# Patient Record
Sex: Female | Born: 1935 | Race: White | Hispanic: No | Smoking: Never smoker
Health system: Southern US, Community
[De-identification: ages and names within clinical notes are randomized; demographics above are authoritative.]

## PROBLEM LIST (undated history)

## (undated) DIAGNOSIS — F0390 Unspecified dementia without behavioral disturbance: Secondary | ICD-10-CM

## (undated) DIAGNOSIS — E079 Disorder of thyroid, unspecified: Secondary | ICD-10-CM

## (undated) DIAGNOSIS — T8859XA Other complications of anesthesia, initial encounter: Secondary | ICD-10-CM

## (undated) DIAGNOSIS — I10 Essential (primary) hypertension: Secondary | ICD-10-CM

## (undated) DIAGNOSIS — T4145XA Adverse effect of unspecified anesthetic, initial encounter: Secondary | ICD-10-CM

## (undated) DIAGNOSIS — I2510 Atherosclerotic heart disease of native coronary artery without angina pectoris: Secondary | ICD-10-CM

## (undated) HISTORY — PX: INNER EAR SURGERY: SHX679

## (undated) HISTORY — PX: CHOLECYSTECTOMY: SHX55

## (undated) HISTORY — PX: CORONARY ARTERY BYPASS GRAFT: SHX141

## (undated) HISTORY — PX: LUMBAR SPINE SURGERY: SHX701

---

## 2011-01-12 ENCOUNTER — Emergency Department (HOSPITAL_COMMUNITY)
Admission: EM | Admit: 2011-01-12 | Discharge: 2011-01-12 | Disposition: A | Discharge status: Home / Self Care | Payer: Medicare Other | Attending: Emergency Medicine | Admitting: Emergency Medicine

## 2011-01-12 DIAGNOSIS — E7800 Pure hypercholesterolemia, unspecified: Secondary | ICD-10-CM | POA: Insufficient documentation

## 2011-01-12 DIAGNOSIS — E039 Hypothyroidism, unspecified: Secondary | ICD-10-CM | POA: Insufficient documentation

## 2011-01-12 DIAGNOSIS — Z951 Presence of aortocoronary bypass graft: Secondary | ICD-10-CM | POA: Insufficient documentation

## 2011-01-12 DIAGNOSIS — I10 Essential (primary) hypertension: Secondary | ICD-10-CM | POA: Insufficient documentation

## 2011-01-12 DIAGNOSIS — F411 Generalized anxiety disorder: Secondary | ICD-10-CM | POA: Insufficient documentation

## 2011-01-12 DIAGNOSIS — F0390 Unspecified dementia without behavioral disturbance: Secondary | ICD-10-CM | POA: Insufficient documentation

## 2011-01-12 LAB — COMPREHENSIVE METABOLIC PANEL
AST: 22 U/L (ref 0–37)
Albumin: 4.1 g/dL (ref 3.5–5.2)
Calcium: 10.2 mg/dL (ref 8.4–10.5)
Chloride: 94 mEq/L — ABNORMAL LOW (ref 96–112)
Creatinine, Ser: 0.98 mg/dL (ref 0.50–1.10)
Total Protein: 7.5 g/dL (ref 6.0–8.3)

## 2011-01-12 LAB — URINALYSIS, ROUTINE W REFLEX MICROSCOPIC
Glucose, UA: NEGATIVE mg/dL
Leukocytes, UA: NEGATIVE
Specific Gravity, Urine: 1.01 (ref 1.005–1.030)

## 2011-01-12 LAB — CBC
MCH: 31 pg (ref 26.0–34.0)
MCHC: 35.1 g/dL (ref 30.0–36.0)
MCV: 88.5 fL (ref 78.0–100.0)
Platelets: 188 10*3/uL (ref 150–400)
RDW: 13 % (ref 11.5–15.5)
WBC: 9.6 10*3/uL (ref 4.0–10.5)

## 2011-01-12 LAB — URINE MICROSCOPIC-ADD ON: Urine-Other: NONE SEEN

## 2011-01-14 ENCOUNTER — Emergency Department (HOSPITAL_COMMUNITY)
Admission: EM | Admit: 2011-01-14 | Discharge: 2011-01-14 | Disposition: A | Discharge status: Home / Self Care | Payer: Medicare Other | Attending: Emergency Medicine | Admitting: Emergency Medicine

## 2011-01-14 DIAGNOSIS — E039 Hypothyroidism, unspecified: Secondary | ICD-10-CM | POA: Insufficient documentation

## 2011-01-14 DIAGNOSIS — F0280 Dementia in other diseases classified elsewhere without behavioral disturbance: Secondary | ICD-10-CM | POA: Insufficient documentation

## 2011-01-14 DIAGNOSIS — G309 Alzheimer's disease, unspecified: Secondary | ICD-10-CM | POA: Insufficient documentation

## 2011-01-14 DIAGNOSIS — Z79899 Other long term (current) drug therapy: Secondary | ICD-10-CM | POA: Insufficient documentation

## 2011-01-14 DIAGNOSIS — I10 Essential (primary) hypertension: Secondary | ICD-10-CM | POA: Insufficient documentation

## 2011-01-14 DIAGNOSIS — Z951 Presence of aortocoronary bypass graft: Secondary | ICD-10-CM | POA: Insufficient documentation

## 2011-01-22 ENCOUNTER — Emergency Department (HOSPITAL_BASED_OUTPATIENT_CLINIC_OR_DEPARTMENT_OTHER)
Admission: EM | Admit: 2011-01-22 | Discharge: 2011-01-22 | Disposition: A | Discharge status: Home / Self Care | Payer: Medicare Other | Attending: Emergency Medicine | Admitting: Emergency Medicine

## 2011-01-22 ENCOUNTER — Emergency Department (INDEPENDENT_AMBULATORY_CARE_PROVIDER_SITE_OTHER): Payer: Medicare Other

## 2011-01-22 ENCOUNTER — Encounter: Payer: Self-pay | Admitting: *Deleted

## 2011-01-22 DIAGNOSIS — Y9210 Unspecified residential institution as the place of occurrence of the external cause: Secondary | ICD-10-CM | POA: Insufficient documentation

## 2011-01-22 DIAGNOSIS — S300XXA Contusion of lower back and pelvis, initial encounter: Secondary | ICD-10-CM

## 2011-01-22 DIAGNOSIS — W050XXA Fall from non-moving wheelchair, initial encounter: Secondary | ICD-10-CM

## 2011-01-22 DIAGNOSIS — F0390 Unspecified dementia without behavioral disturbance: Secondary | ICD-10-CM | POA: Insufficient documentation

## 2011-01-22 DIAGNOSIS — M533 Sacrococcygeal disorders, not elsewhere classified: Secondary | ICD-10-CM

## 2011-01-22 HISTORY — DX: Essential (primary) hypertension: I10

## 2011-01-22 HISTORY — DX: Disorder of thyroid, unspecified: E07.9

## 2011-01-22 HISTORY — DX: Unspecified dementia, unspecified severity, without behavioral disturbance, psychotic disturbance, mood disturbance, and anxiety: F03.90

## 2011-01-22 MED ORDER — ACETAMINOPHEN 325 MG PO TABS
650.0000 mg | ORAL_TABLET | Freq: Once | ORAL | Status: AC
  Administered 2011-01-22: 650 mg via ORAL
  Filled 2011-01-22: qty 2

## 2011-01-22 NOTE — ED Notes (Signed)
Pt. Family waiting on xray results and calling [facility] about Pt. Being transported by [facility] back to the [facility].  Pt. Is in no distress and is sleepy/ per EMS / per [facility] this is normal for the Pt.  Being sleepy.

## 2011-01-22 NOTE — ED Notes (Signed)
Pt. Has noted bruising on the L wrist area; red and blue in color.  Pt. Daughter at bedside aware of bruising on the L wrist.

## 2011-01-22 NOTE — ED Notes (Signed)
Placed abx ointment on the L elbow skin tear after cleaning the L elbow.  Noted kerlex placed on L elbow with gauze dressing.

## 2011-01-22 NOTE — ED Notes (Signed)
Pt. Is noted to have normal behavior per EMS staff.  Pt. Will answer some questions but also has history of dementia,

## 2011-01-22 NOTE — Discharge Instructions (Signed)
 Take 650 mg of tylenol  by mouth every 6 hours as needed for pain. Please be rechecked by your own doctor in 2 days.

## 2011-01-22 NOTE — ED Notes (Signed)
Pt. Is from Memory Care unit in [facility] and fell out of W/C   No complaints of Pain with EMS.  Pt. Is in normal behavior and is normally sleepy.   [facility] in Memory Care Unit is Pt. Home.

## 2011-01-22 NOTE — ED Notes (Signed)
Pt. Dressed by RN and EMT. Family at bedside during dressing the Pt.  Pt. Smiling and awake at discharge,

## 2011-01-22 NOTE — ED Provider Notes (Signed)
History     CSN: [id number] Arrival date & time: No admission date for patient encounter.  No chief complaint on file.   (Consider location/radiation/quality/duration/timing/severity/associated sxs/prior treatment) HPI Comments: Level 5 caveat due to history of dementia.  Pt was sitting on edge of wheelchair, was with technician at memory care unit of nursing facility and she slid down off of edge of chair onto the floor.  No head injury.  Pt was sent for further evaluation.  Pt did not complain of pain to EMS.  They transported here uneventfully.  Pt denies to me any HA, CP, SOB, abd pain, pain to back, neck, hips, elbows, shoulders.  Pt had minimal skin abrasion without any bleeding to left elbow tip.    The history is provided by the patient and the EMS personnel.    No past medical history on file.  No past surgical history on file.  No family history on file.  History  Substance Use Topics  . Smoking status: Not on file  . Smokeless tobacco: Not on file  . Alcohol Use: Not on file    OB History    No data available      Review of Systems  Unable to perform ROS: Dementia    Allergies  Review of patient's allergies indicates not on file.  Home Medications  No current outpatient prescriptions on file.  There were no vitals taken for this visit.  Physical Exam  Constitutional: She appears well-developed and well-nourished. No distress.  HENT:  Head: Normocephalic and atraumatic.  Eyes: Pupils are equal, round, and reactive to light.  Neck: Normal range of motion. Neck supple.  Pulmonary/Chest: Effort normal and breath sounds normal.  Abdominal: Soft. There is no tenderness. There is no rebound and no guarding.  Musculoskeletal: Normal range of motion. She exhibits no tenderness.       Back:  Neurological: She is alert. She has normal strength.  Skin: Skin is warm and dry.    ED Course  Procedures (including critical care time)  Labs Reviewed - No data  to display No results found.   No diagnosis found.    MDM  Pt's only exam findings are superficial kin abrasion to elbow with no suspicion for joint injury or fracture.  Did not violate through dermis.  Also  Mild tenderness at lower sacrum and coccyx. Pt can move both lower extremities equally and ROM of both hips are normal.  Will get plain films, most likely just contusion to coccyx is all.        4:42 PM I reviewed plain films, no obv fracture.    Gavin Pound. Liese Dizdarevic, MD 01/22/11 1642

## 2011-03-05 ENCOUNTER — Other Ambulatory Visit: Payer: Self-pay

## 2011-03-05 ENCOUNTER — Emergency Department (HOSPITAL_COMMUNITY)
Admission: EM | Admit: 2011-03-05 | Discharge: 2011-03-05 | Disposition: A | Discharge status: Home / Self Care | Payer: Medicare Other | Attending: Emergency Medicine | Admitting: Emergency Medicine

## 2011-03-05 ENCOUNTER — Emergency Department (HOSPITAL_COMMUNITY): Payer: Medicare Other

## 2011-03-05 ENCOUNTER — Encounter (HOSPITAL_COMMUNITY): Payer: Self-pay | Admitting: Emergency Medicine

## 2011-03-05 DIAGNOSIS — I10 Essential (primary) hypertension: Secondary | ICD-10-CM | POA: Insufficient documentation

## 2011-03-05 DIAGNOSIS — R55 Syncope and collapse: Secondary | ICD-10-CM | POA: Insufficient documentation

## 2011-03-05 DIAGNOSIS — I498 Other specified cardiac arrhythmias: Secondary | ICD-10-CM | POA: Insufficient documentation

## 2011-03-05 DIAGNOSIS — E079 Disorder of thyroid, unspecified: Secondary | ICD-10-CM | POA: Insufficient documentation

## 2011-03-05 DIAGNOSIS — R404 Transient alteration of awareness: Secondary | ICD-10-CM | POA: Insufficient documentation

## 2011-03-05 DIAGNOSIS — Z79899 Other long term (current) drug therapy: Secondary | ICD-10-CM | POA: Insufficient documentation

## 2011-03-05 DIAGNOSIS — F0390 Unspecified dementia without behavioral disturbance: Secondary | ICD-10-CM | POA: Insufficient documentation

## 2011-03-05 DIAGNOSIS — I2510 Atherosclerotic heart disease of native coronary artery without angina pectoris: Secondary | ICD-10-CM | POA: Insufficient documentation

## 2011-03-05 DIAGNOSIS — F29 Unspecified psychosis not due to a substance or known physiological condition: Secondary | ICD-10-CM | POA: Insufficient documentation

## 2011-03-05 DIAGNOSIS — R413 Other amnesia: Secondary | ICD-10-CM | POA: Insufficient documentation

## 2011-03-05 DIAGNOSIS — Z9889 Other specified postprocedural states: Secondary | ICD-10-CM | POA: Insufficient documentation

## 2011-03-05 HISTORY — DX: Atherosclerotic heart disease of native coronary artery without angina pectoris: I25.10

## 2011-03-05 LAB — CBC
HCT: 40.2 % (ref 36.0–46.0)
Hemoglobin: 13.5 g/dL (ref 12.0–15.0)
RDW: 13.6 % (ref 11.5–15.5)
WBC: 10.1 10*3/uL (ref 4.0–10.5)

## 2011-03-05 LAB — BASIC METABOLIC PANEL
CO2: 23 mEq/L (ref 19–32)
Chloride: 105 mEq/L (ref 96–112)
Creatinine, Ser: 1.02 mg/dL (ref 0.50–1.10)
GFR calc Af Amer: 56 mL/min — ABNORMAL LOW (ref >90)
Potassium: 3.5 mEq/L (ref 3.5–5.1)

## 2011-03-05 LAB — DIFFERENTIAL
Basophils Absolute: 0 10*3/uL (ref 0.0–0.1)
Basophils Relative: 0 % (ref 0–1)
Lymphocytes Relative: 27 % (ref 12–46)
Monocytes Absolute: 1.1 10*3/uL — ABNORMAL HIGH (ref 0.1–1.0)
Monocytes Relative: 11 % (ref 3–12)
Neutro Abs: 6 10*3/uL (ref 1.7–7.7)
Neutrophils Relative %: 60 % (ref 43–77)

## 2011-03-05 LAB — URINALYSIS, ROUTINE W REFLEX MICROSCOPIC
Glucose, UA: NEGATIVE mg/dL
Ketones, ur: NEGATIVE mg/dL
Leukocytes, UA: NEGATIVE
Nitrite: NEGATIVE
Specific Gravity, Urine: 1.012 (ref 1.005–1.030)
pH: 6.5 (ref 5.0–8.0)

## 2011-03-05 MED ORDER — SODIUM CHLORIDE 0.9 % IV BOLUS (SEPSIS)
1000.0000 mL | Freq: Once | INTRAVENOUS | Status: AC
  Administered 2011-03-05: 1000 mL via INTRAVENOUS

## 2011-03-05 NOTE — ED Provider Notes (Signed)
12:08 PM  Date: 03/05/2011  Rate: 49  Rhythm: sinus bradycardia  QRS Axis: normal  Intervals: PR prolonged  ST/T Wave abnormalities: normal  Conduction Disutrbances:none  Narrative Interpretation: Abnormal EKG.   Old EKG Reviewed: none available    Carleene Cooper III, MD 03/05/11 1209

## 2011-03-05 NOTE — ED Notes (Signed)
Family at bedside. 

## 2011-03-05 NOTE — ED Provider Notes (Signed)
History     CSN: [id number] Arrival date & time: 03/05/2011 11:50 AM   First MD Initiated Contact with Patient 03/05/11 1233      Chief Complaint  Patient presents with  . Near Syncope    (Consider location/radiation/quality/duration/timing/severity/associated sxs/prior treatment) Patient is a 77 y.o. female presenting with syncope. The history is provided by the patient. No language interpreter was used.  Loss of Consciousness This is a new problem. The current episode started today. The problem occurs rarely. The problem has been resolved. Pertinent negatives include no abdominal pain, chest pain, congestion, coughing, diaphoresis, fever, headaches, nausea, neck pain, numbness, rash, sore throat, swollen glands, urinary symptoms, vomiting or weakness. The symptoms are aggravated by nothing. She has tried nothing for the symptoms.  Loss of Consciousness This is a new problem. The current episode started today. The problem occurs rarely. The problem has been resolved. Pertinent negatives include no chest pain, no abdominal pain and no headaches. The symptoms are aggravated by nothing. She has tried nothing for the symptoms.  Patient coming from [facility] today via EMS with complaint of a near syncopal episode. Staff states that patient was ambulating down the hall when she fell to her knees and was caught by the staff on the way down. Patient did not lose consciousness. Receive 500 cc bolus in route CBG was 182. Patient is demented and a poor historian. Patient's systolic blood pressure was found to be in the 80s upon arrival of EMS. Patient is presently alert but confused. Past Medical History  Diagnosis Date  . Dementia   . Hypertension   . Thyroid disease   . Coronary artery disease   . Dementia     Past Surgical History  Procedure Date  . Lumbar spine surgery     No family history on file.  History  Substance Use Topics  . Smoking status: Never Smoker   . Smokeless  tobacco: Not on file  . Alcohol Use: Yes     Occassional    OB History    Grav Para Term Preterm Abortions TAB SAB Ect Mult Living                  Review of Systems  Unable to perform ROS Constitutional: Negative for fever and diaphoresis.  HENT: Negative for congestion, sore throat and neck pain.   Respiratory: Negative for cough.   Cardiovascular: Positive for syncope. Negative for chest pain.  Gastrointestinal: Negative for nausea, vomiting and abdominal pain.  Skin: Negative for rash.  Neurological: Negative for weakness, numbness and headaches.    Allergies  Aricept; Aspirin; and Milk-related compounds  Home Medications   Current Outpatient Rx  Name Route Sig Dispense Refill  . CETIRIZINE HCL 10 MG PO TABS Oral Take 10 mg by mouth at bedtime. Take for 7 days starting 03/03/11     . VITAMIN D 1000 UNITS PO TABS Oral Take 1,000 Units by mouth daily.     Marland Kitchen CLOPIDOGREL BISULFATE 75 MG PO TABS Oral Take 75 mg by mouth daily.      Marland Kitchen LEVOTHYROXINE SODIUM 75 MCG PO TABS Oral Take 50 mcg by mouth daily.     Marland Kitchen LISINOPRIL-HYDROCHLOROTHIAZIDE 20-25 MG PO TABS Oral Take 1 tablet by mouth daily.      Marland Kitchen MEMANTINE HCL 10 MG PO TABS Oral Take 10 mg by mouth 2 (two) times daily.      Marland Kitchen METOPROLOL TARTRATE 25 MG PO TABS Oral Take 25 mg by mouth 2 (  two) times daily.      . QUETIAPINE FUMARATE 25 MG PO TABS Oral Take 25 mg by mouth 2 (two) times daily.      Marland Kitchen SIMVASTATIN 10 MG PO TABS Oral Take 20 mg by mouth at bedtime.     Marland Kitchen DIAZEPAM 5 MG PO TABS Oral Take 2.5-5 mg by mouth 2 (two) times daily.      Marland Kitchen DIAZEPAM 5 MG PO TABS Oral Take 5 mg by mouth at bedtime as needed. Anxiety and sleep     . GUAIFENESIN-DM 100-10 MG/5ML PO SYRP Oral Take 10 mLs by mouth every 4 (four) hours as needed. For cough       BP 86/52  Pulse 57  Temp(Src) 98.2 F (36.8 C) (Oral)  Resp 18  SpO2 100%  Physical Exam  Nursing note and vitals reviewed. Constitutional: She is oriented to person, place, and  time. She appears well-developed and well-nourished.  HENT:  Head: Normocephalic and atraumatic.  Eyes: Pupils are equal, round, and reactive to light.  Cardiovascular: Normal heart sounds.  Bradycardia present.  Exam reveals no gallop and no friction rub.   No murmur heard.      HR 57  Pulmonary/Chest: Effort normal and breath sounds normal.  Abdominal: Soft.  Musculoskeletal: Normal range of motion. She exhibits no edema and no tenderness.  Neurological: She is alert and oriented to person, place, and time.  Skin: Skin is warm and dry.  Psychiatric: She has a normal mood and affect.    ED Course  Procedures (including critical care time)  Labs Reviewed  CBC - Abnormal; Notable for the following:    Platelets 145 (*)    All other components within normal limits  DIFFERENTIAL - Abnormal; Notable for the following:    Monocytes Absolute 1.1 (*)    All other components within normal limits  BASIC METABOLIC PANEL - Abnormal; Notable for the following:    Glucose, Bld 103 (*)    GFR calc non Af Amer 49 (*)    GFR calc Af Amer 56 (*)    All other components within normal limits  URINALYSIS, ROUTINE W REFLEX MICROSCOPIC  I-STAT TROPONIN I   Dg Chest 2 View  03/05/2011  *RADIOLOGY REPORT*  Clinical Data: Syncope, memory impairment, confusion, hypertension  CHEST - 2 VIEW  Comparison: None  Findings: Upper normal heart size post median sternotomy. Atherosclerotic calcification of tortuous thoracic aorta. Mediastinal contours and pulmonary vascular otherwise normal. Lungs appear mildly emphysematous with minimal peribronchial thickening and right basilar atelectasis. No gross infiltrate or effusion. Diffuse osseous demineralization.  IMPRESSION: Post median sternotomy. Question emphysematous and bronchitic changes. Mild right basilar atelectasis.  Original Report Authenticated By: Lollie Marrow, M.D.     No diagnosis found.    MDM    Date: 03/07/2011    Here with near syncopal  episode at [facility] today.  SBP was found to be in the 80's. Fluid bolus given in the ER.  CBC and Bmet normal.  No UTI.  Chest x-ray normal.  She was not orthostatic in the ER. Ambulating normally.  Want to go back.  Daughter at bedside States no heroic measures to be taken.  Spoke about code status.  She will discuss with Dr. Redmond School for the future. Lisinopril and lopressor should be held tonight.  Medical screening examination/treatment/procedure(s) were performed by non-physician practitioner and as supervising physician I was immediately available for consultation/collaboration. Osvaldo Human, M.D.     Jethro Bastos,  NP 03/07/11 1037  Carleene Cooper III, MD 03/12/11 [phone]

## 2011-03-05 NOTE — ED Notes (Signed)
Per EMS, pt to ED from [facility] with c/o syncope.  Per EMS, pt walking in hall at residence when she "became weak in the knees"  (per staff).  Pt was helped to the ground.  Staff documented SBP at approximately &).  In route, pt received O2 via [place] at 2L/min, 18G IV to Left  AC.  Received bolus.  CBG was taken with results of 182.

## 2011-03-05 NOTE — ED Notes (Signed)
No old EKG in muse

## 2011-04-21 ENCOUNTER — Emergency Department (HOSPITAL_COMMUNITY): Payer: Medicare Other

## 2011-04-21 ENCOUNTER — Other Ambulatory Visit: Payer: Self-pay

## 2011-04-21 ENCOUNTER — Encounter (HOSPITAL_COMMUNITY): Payer: Self-pay | Admitting: Adult Health

## 2011-04-21 ENCOUNTER — Emergency Department (HOSPITAL_COMMUNITY)
Admission: EM | Admit: 2011-04-21 | Discharge: 2011-04-21 | Disposition: A | Discharge status: Home / Self Care | Payer: Medicare Other | Attending: Emergency Medicine | Admitting: Emergency Medicine

## 2011-04-21 DIAGNOSIS — I498 Other specified cardiac arrhythmias: Secondary | ICD-10-CM | POA: Insufficient documentation

## 2011-04-21 DIAGNOSIS — W19XXXA Unspecified fall, initial encounter: Secondary | ICD-10-CM

## 2011-04-21 DIAGNOSIS — I10 Essential (primary) hypertension: Secondary | ICD-10-CM | POA: Insufficient documentation

## 2011-04-21 DIAGNOSIS — F068 Other specified mental disorders due to known physiological condition: Secondary | ICD-10-CM | POA: Insufficient documentation

## 2011-04-21 DIAGNOSIS — R5381 Other malaise: Secondary | ICD-10-CM | POA: Insufficient documentation

## 2011-04-21 DIAGNOSIS — W010XXA Fall on same level from slipping, tripping and stumbling without subsequent striking against object, initial encounter: Secondary | ICD-10-CM | POA: Insufficient documentation

## 2011-04-21 DIAGNOSIS — I2510 Atherosclerotic heart disease of native coronary artery without angina pectoris: Secondary | ICD-10-CM | POA: Insufficient documentation

## 2011-04-21 DIAGNOSIS — N390 Urinary tract infection, site not specified: Secondary | ICD-10-CM

## 2011-04-21 DIAGNOSIS — Z79899 Other long term (current) drug therapy: Secondary | ICD-10-CM | POA: Insufficient documentation

## 2011-04-21 DIAGNOSIS — R55 Syncope and collapse: Secondary | ICD-10-CM

## 2011-04-21 LAB — URINE MICROSCOPIC-ADD ON

## 2011-04-21 LAB — URINALYSIS, ROUTINE W REFLEX MICROSCOPIC
Glucose, UA: NEGATIVE mg/dL
Hgb urine dipstick: NEGATIVE
Ketones, ur: NEGATIVE mg/dL
Nitrite: NEGATIVE
Protein, ur: NEGATIVE mg/dL
Specific Gravity, Urine: 1.014 (ref 1.005–1.030)
Urobilinogen, UA: 2 mg/dL — ABNORMAL HIGH (ref 0.0–1.0)
pH: 6.5 (ref 5.0–8.0)

## 2011-04-21 LAB — CBC
HCT: 38.3 % (ref 36.0–46.0)
Hemoglobin: 13.1 g/dL (ref 12.0–15.0)
MCH: 31.3 pg (ref 26.0–34.0)
MCHC: 34.2 g/dL (ref 30.0–36.0)
MCV: 91.6 fL (ref 78.0–100.0)
Platelets: 151 10*3/uL (ref 150–400)
RBC: 4.18 MIL/uL (ref 3.87–5.11)
RDW: 12.8 % (ref 11.5–15.5)
WBC: 7.3 10*3/uL (ref 4.0–10.5)

## 2011-04-21 LAB — POCT I-STAT TROPONIN I: Troponin i, poc: 0 ng/mL (ref 0.00–0.08)

## 2011-04-21 LAB — BASIC METABOLIC PANEL
BUN: 15 mg/dL (ref 6–23)
CO2: 28 mEq/L (ref 19–32)
Calcium: 9.6 mg/dL (ref 8.4–10.5)
Chloride: 101 mEq/L (ref 96–112)
Creatinine, Ser: 1.25 mg/dL — ABNORMAL HIGH (ref 0.50–1.10)
GFR calc Af Amer: 44 mL/min — ABNORMAL LOW (ref >90)
GFR calc non Af Amer: 38 mL/min — ABNORMAL LOW (ref >90)
Glucose, Bld: 120 mg/dL — ABNORMAL HIGH (ref 70–99)
Potassium: 3.9 mEq/L (ref 3.5–5.1)
Sodium: 137 mEq/L (ref 135–145)

## 2011-04-21 MED ORDER — SULFAMETHOXAZOLE-TRIMETHOPRIM 800-160 MG PO TABS: 1.0000 | ORAL_TABLET | Freq: Two times a day (BID) | ORAL | Status: AC

## 2011-04-21 MED ORDER — SODIUM CHLORIDE 0.9 % IV BOLUS (SEPSIS)
1000.0000 mL | Freq: Once | INTRAVENOUS | Status: AC
  Administered 2011-04-21: 1000 mL via INTRAVENOUS

## 2011-04-21 MED ORDER — DEXTROSE 5 % IV SOLN
1.0000 g | Freq: Once | INTRAVENOUS | Status: AC
  Administered 2011-04-21: 1 g via INTRAVENOUS
  Filled 2011-04-21: qty 10

## 2011-04-21 NOTE — ED Notes (Signed)
From [place] walking in cafeteria and had a syncopal episode, initial BP 70s, placed iin trendelemburg. EMS arival CBG 143, bp 90s, on LSB.

## 2011-04-21 NOTE — ED Provider Notes (Signed)
History    87yF with with near syncope. Triage note reviewed. Pt doesn't think lost consciousness though. Was walking when lost balance and fell. Not sure if tripped or why fell. Doesn't think hit head. Denies significant acute pain anywhere. No n/v. Feels tired. No urinary complaints. Feeling fine earlier in day. No fever or chills.  CSN: [id number]  Arrival date & time 04/21/11  1056   First MD Initiated Contact with Patient 04/21/11 1111      Chief Complaint  Patient presents with  . Loss of Consciousness    (Consider location/radiation/quality/duration/timing/severity/associated sxs/prior treatment) HPI  Past Medical History  Diagnosis Date  . Dementia   . Hypertension   . Thyroid disease   . Coronary artery disease   . Dementia     Past Surgical History  Procedure Date  . Lumbar spine surgery     History reviewed. No pertinent family history.  History  Substance Use Topics  . Smoking status: Never Smoker   . Smokeless tobacco: Not on file  . Alcohol Use: Yes     Occassional    OB History    Grav Para Term Preterm Abortions TAB SAB Ect Mult Living                  Review of Systems   Review of symptoms negative unless otherwise noted in HPI.  Allergies  Aricept; Aspirin; and Milk-related compounds  Home Medications   Current Outpatient Rx  Name Route Sig Dispense Refill  . CETIRIZINE HCL 10 MG PO TABS Oral Take 10 mg by mouth at bedtime. Take for 7 days starting 03/03/11     . VITAMIN D 1000 UNITS PO TABS Oral Take 1,000 Units by mouth daily.     Marland Kitchen CLOPIDOGREL BISULFATE 75 MG PO TABS Oral Take 75 mg by mouth daily.      Marland Kitchen DIAZEPAM 5 MG PO TABS Oral Take 2.5-5 mg by mouth 2 (two) times daily.      Marland Kitchen DIAZEPAM 5 MG PO TABS Oral Take 5 mg by mouth at bedtime as needed. Anxiety and sleep     . GUAIFENESIN-DM 100-10 MG/5ML PO SYRP Oral Take 10 mLs by mouth every 4 (four) hours as needed. For cough     . LEVOTHYROXINE SODIUM 75 MCG PO TABS Oral Take 50  mcg by mouth daily.     Marland Kitchen LISINOPRIL-HYDROCHLOROTHIAZIDE 20-25 MG PO TABS Oral Take 1 tablet by mouth daily.      Marland Kitchen MEMANTINE HCL 10 MG PO TABS Oral Take 10 mg by mouth 2 (two) times daily.      Marland Kitchen METOPROLOL TARTRATE 25 MG PO TABS Oral Take 25 mg by mouth 2 (two) times daily.      . QUETIAPINE FUMARATE 25 MG PO TABS Oral Take 25 mg by mouth 2 (two) times daily.      Marland Kitchen SIMVASTATIN 10 MG PO TABS Oral Take 20 mg by mouth at bedtime.       BP 115/45  Pulse 48  Temp(Src) 97.9 F (36.6 C) (Oral)  Resp 18  SpO2 97%  Physical Exam  Nursing note and vitals reviewed. Constitutional: She appears well-developed and well-nourished. No distress.  HENT:  Head: Normocephalic and atraumatic.  Eyes: Conjunctivae and EOM are normal. Pupils are equal, round, and reactive to light. Right eye exhibits no discharge. Left eye exhibits no discharge.  Neck: Neck supple.  Cardiovascular: Regular rhythm and normal heart sounds.  Exam reveals no gallop and no friction rub.  No murmur heard.      bradycardic  Pulmonary/Chest: Effort normal and breath sounds normal. No respiratory distress.  Abdominal: Soft. She exhibits no distension. There is no tenderness.  Musculoskeletal: She exhibits no edema and no tenderness.       No midline spinal tenderness  Neurological: She is alert. No cranial nerve deficit. She exhibits normal muscle tone.  Skin: Skin is warm and dry.  Psychiatric: She has a normal mood and affect. Her behavior is normal. Thought content normal.    ED Course  Procedures (including critical care time)  Labs Reviewed  BASIC METABOLIC PANEL - Abnormal; Notable for the following:    Glucose, Bld 120 (*)    Creatinine, Ser 1.25 (*)    GFR calc non Af Amer 38 (*)    GFR calc Af Amer 44 (*)    All other components within normal limits  URINALYSIS, ROUTINE W REFLEX MICROSCOPIC - Abnormal; Notable for the following:    APPearance CLOUDY (*)    Bilirubin Urine SMALL (*)    Urobilinogen, UA 2.0  (*)    Leukocytes, UA LARGE (*)    All other components within normal limits  URINE MICROSCOPIC-ADD ON - Abnormal; Notable for the following:    Bacteria, UA MANY (*)    Casts HYALINE CASTS (*)    All other components within normal limits  CBC  POCT I-STAT TROPONIN I  I-STAT TROPONIN I   Ct Head Wo Contrast  04/21/2011  *RADIOLOGY REPORT*  Clinical Data: Syncope.  CT HEAD WITHOUT CONTRAST  Technique:  Contiguous axial images were obtained from the base of the skull through the vertex without contrast.  Comparison: None.  Findings: There is atrophy and chronic small vessel disease changes. No acute intracranial abnormality.  Specifically, no hemorrhage, hydrocephalus, mass lesion, acute infarction, or significant intracranial injury.  No acute calvarial abnormality. Visualized paranasal sinuses and mastoids clear.  Orbital soft tissues unremarkable.  IMPRESSION: No acute intracranial abnormality.  Atrophy, chronic microvascular disease.  Original Report Authenticated By: Cyndie Chime, M.D.   Dg Chest Portable 1 View  04/21/2011  *RADIOLOGY REPORT*  Clinical Data: Syncope.  PORTABLE CHEST - 1 VIEW  Comparison: 03/05/2011  Findings: Median sternotomy.  Lungs are clear.  Heart is normal size.  No effusions or acute bony abnormality.  IMPRESSION: No active disease.  Original Report Authenticated By: Cyndie Chime, M.D.   EKG:  Rhythm: sinus bradycardia Rate: 59 Axis: normal Intervals:1st degree av block ST segments: NS ST changes. Flattening anteriorly which was noted on previous.   1. Urinary tract infection   2. Fall   3. Near syncope       MDM  Here with near syncopal episode at [facility] today. SBP was found to be in the 70's. Fluid bolus given in the ER with improvement of BP and has remained stable. Possible related to slight dehydration or use of BB medication. UA consistent with UTI. Dose of abx in ED and script for same. CBC and BMP relatively unremarkable. EKG with  sinus brady and NS ST changes which were noted on previous. CT head with no acute change. Trop WNL. Pt being discharged to monitored environment. Outpt fu as needed.       Raeford Razor, MD 04/27/11 [phone]

## 2011-04-21 NOTE — ED Notes (Signed)
RUE:AV40<JW> Expected date:04/21/11<BR> Expected time:10:55 AM<BR> Means of arrival:Ambulance<BR> Comments:<BR> EMS 70 GC - syncope/hypotensive

## 2011-04-21 NOTE — ED Notes (Signed)
Patient is resting comfortably. 

## 2012-01-21 ENCOUNTER — Emergency Department (HOSPITAL_COMMUNITY): Payer: Medicare Other

## 2012-01-21 ENCOUNTER — Inpatient Hospital Stay (HOSPITAL_COMMUNITY)
Admission: EM | Admit: 2012-01-21 | Discharge: 2012-01-24 | DRG: 871 | Disposition: A | Discharge status: Nursing Home (Includes ICF) | Payer: Medicare Other | Attending: Internal Medicine | Admitting: Internal Medicine

## 2012-01-21 ENCOUNTER — Encounter (HOSPITAL_COMMUNITY): Payer: Self-pay | Admitting: *Deleted

## 2012-01-21 DIAGNOSIS — E079 Disorder of thyroid, unspecified: Secondary | ICD-10-CM | POA: Diagnosis present

## 2012-01-21 DIAGNOSIS — Z79899 Other long term (current) drug therapy: Secondary | ICD-10-CM

## 2012-01-21 DIAGNOSIS — Z886 Allergy status to analgesic agent status: Secondary | ICD-10-CM

## 2012-01-21 DIAGNOSIS — I2510 Atherosclerotic heart disease of native coronary artery without angina pectoris: Secondary | ICD-10-CM | POA: Diagnosis present

## 2012-01-21 DIAGNOSIS — E039 Hypothyroidism, unspecified: Secondary | ICD-10-CM | POA: Diagnosis present

## 2012-01-21 DIAGNOSIS — R55 Syncope and collapse: Secondary | ICD-10-CM | POA: Diagnosis present

## 2012-01-21 DIAGNOSIS — Z7902 Long term (current) use of antithrombotics/antiplatelets: Secondary | ICD-10-CM

## 2012-01-21 DIAGNOSIS — Z888 Allergy status to other drugs, medicaments and biological substances status: Secondary | ICD-10-CM

## 2012-01-21 DIAGNOSIS — R4182 Altered mental status, unspecified: Secondary | ICD-10-CM

## 2012-01-21 DIAGNOSIS — F0390 Unspecified dementia without behavioral disturbance: Secondary | ICD-10-CM | POA: Diagnosis present

## 2012-01-21 DIAGNOSIS — I959 Hypotension, unspecified: Secondary | ICD-10-CM

## 2012-01-21 DIAGNOSIS — A419 Sepsis, unspecified organism: Principal | ICD-10-CM

## 2012-01-21 DIAGNOSIS — Z951 Presence of aortocoronary bypass graft: Secondary | ICD-10-CM

## 2012-01-21 DIAGNOSIS — Z9089 Acquired absence of other organs: Secondary | ICD-10-CM

## 2012-01-21 DIAGNOSIS — G9340 Encephalopathy, unspecified: Secondary | ICD-10-CM

## 2012-01-21 DIAGNOSIS — I10 Essential (primary) hypertension: Secondary | ICD-10-CM | POA: Diagnosis present

## 2012-01-21 DIAGNOSIS — E876 Hypokalemia: Secondary | ICD-10-CM

## 2012-01-21 DIAGNOSIS — J189 Pneumonia, unspecified organism: Secondary | ICD-10-CM

## 2012-01-21 HISTORY — DX: Other complications of anesthesia, initial encounter: T88.59XA

## 2012-01-21 HISTORY — DX: Adverse effect of unspecified anesthetic, initial encounter: T41.45XA

## 2012-01-21 LAB — PROCALCITONIN: Procalcitonin: <0.1 ng/mL

## 2012-01-21 LAB — URINALYSIS, ROUTINE W REFLEX MICROSCOPIC
Bilirubin Urine: NEGATIVE
Hgb urine dipstick: NEGATIVE
Specific Gravity, Urine: 1.018 (ref 1.005–1.030)
Urobilinogen, UA: 2 mg/dL — ABNORMAL HIGH (ref 0.0–1.0)

## 2012-01-21 LAB — CREATININE, SERUM
Creatinine, Ser: 0.9 mg/dL (ref 0.50–1.10)
GFR calc non Af Amer: 56 mL/min — ABNORMAL LOW (ref >90)

## 2012-01-21 LAB — COMPREHENSIVE METABOLIC PANEL
Albumin: 3.3 g/dL — ABNORMAL LOW (ref 3.5–5.2)
Alkaline Phosphatase: 71 U/L (ref 39–117)
BUN: 24 mg/dL — ABNORMAL HIGH (ref 6–23)
Chloride: 105 mEq/L (ref 96–112)
GFR calc Af Amer: 55 mL/min — ABNORMAL LOW (ref >90)
Glucose, Bld: 137 mg/dL — ABNORMAL HIGH (ref 70–99)
Potassium: 3.6 mEq/L (ref 3.5–5.1)
Total Bilirubin: 0.6 mg/dL (ref 0.3–1.2)

## 2012-01-21 LAB — BLOOD GAS, ARTERIAL
Acid-Base Excess: 2 mmol/L (ref 0.0–2.0)
O2 Content: 2 L/min
pCO2 arterial: 39 mmHg (ref 35.0–45.0)
pH, Arterial: 7.435 (ref 7.350–7.450)
pO2, Arterial: 147 mmHg — ABNORMAL HIGH (ref 80.0–100.0)

## 2012-01-21 LAB — LACTIC ACID, PLASMA: Lactic Acid, Venous: 2.3 mmol/L — ABNORMAL HIGH (ref 0.5–2.2)

## 2012-01-21 LAB — CBC
HCT: 39.2 % (ref 36.0–46.0)
Hemoglobin: 13.2 g/dL (ref 12.0–15.0)
MCH: 31.1 pg (ref 26.0–34.0)
MCHC: 34 g/dL (ref 30.0–36.0)
Platelets: 130 10*3/uL — ABNORMAL LOW (ref 150–400)
RDW: 12.5 % (ref 11.5–15.5)
WBC: 5.4 10*3/uL (ref 4.0–10.5)

## 2012-01-21 LAB — URINE MICROSCOPIC-ADD ON

## 2012-01-21 MED ORDER — ENOXAPARIN SODIUM 40 MG/0.4ML ~~LOC~~ SOLN
40.0000 mg | SUBCUTANEOUS | Status: DC
  Administered 2012-01-22: 40 mg via SUBCUTANEOUS
  Filled 2012-01-21 (×5): qty 0.4

## 2012-01-21 MED ORDER — QUETIAPINE FUMARATE 25 MG PO TABS
25.0000 mg | ORAL_TABLET | Freq: Two times a day (BID) | ORAL | Status: DC
  Administered 2012-01-21 – 2012-01-24 (×5): 25 mg via ORAL
  Filled 2012-01-21 (×7): qty 1

## 2012-01-21 MED ORDER — SODIUM CHLORIDE 0.9 % IV SOLN
INTRAVENOUS | Status: DC
  Administered 2012-01-23: 04:00:00 via INTRAVENOUS

## 2012-01-21 MED ORDER — LEVOTHYROXINE SODIUM 50 MCG PO TABS
50.0000 ug | ORAL_TABLET | Freq: Every day | ORAL | Status: DC
  Administered 2012-01-22 – 2012-01-24 (×3): 50 ug via ORAL
  Filled 2012-01-21 (×5): qty 1

## 2012-01-21 MED ORDER — ACETAMINOPHEN 650 MG RE SUPP: 650.0000 mg | Freq: Four times a day (QID) | RECTAL | Status: DC | PRN

## 2012-01-21 MED ORDER — LORAZEPAM 2 MG/ML IJ SOLN
0.5000 mg | Freq: Once | INTRAMUSCULAR | Status: AC
  Administered 2012-01-21: 0.5 mg via INTRAVENOUS
  Filled 2012-01-21: qty 1

## 2012-01-21 MED ORDER — LEVOFLOXACIN IN D5W 750 MG/150ML IV SOLN
750.0000 mg | INTRAVENOUS | Status: AC
Start: 2012-01-23 — End: 2012-01-23
  Administered 2012-01-23: 750 mg via INTRAVENOUS
  Filled 2012-01-21: qty 150

## 2012-01-21 MED ORDER — SODIUM CHLORIDE 0.9 % IV SOLN
750.0000 mg | INTRAVENOUS | Status: DC
  Administered 2012-01-21 – 2012-01-23 (×3): 750 mg via INTRAVENOUS
  Filled 2012-01-21 (×3): qty 750

## 2012-01-21 MED ORDER — VITAMIN D3 25 MCG (1000 UNIT) PO TABS
1000.0000 [IU] | ORAL_TABLET | Freq: Every day | ORAL | Status: DC
  Administered 2012-01-22 – 2012-01-24 (×3): 1000 [IU] via ORAL
  Filled 2012-01-21 (×3): qty 1

## 2012-01-21 MED ORDER — DEXTROSE 5 % IV SOLN
2.0000 g | Freq: Two times a day (BID) | INTRAVENOUS | Status: DC
  Administered 2012-01-21 – 2012-01-22 (×2): 2 g via INTRAVENOUS
  Filled 2012-01-21 (×3): qty 2

## 2012-01-21 MED ORDER — ACETAMINOPHEN 325 MG PO TABS: 650.0000 mg | ORAL_TABLET | Freq: Four times a day (QID) | ORAL | Status: DC | PRN

## 2012-01-21 MED ORDER — DEXTROSE 5 % IV SOLN: 1.0000 g | Freq: Three times a day (TID) | INTRAVENOUS | Status: DC

## 2012-01-21 MED ORDER — LORATADINE 10 MG PO TABS
10.0000 mg | ORAL_TABLET | Freq: Every day | ORAL | Status: DC
  Administered 2012-01-21 – 2012-01-22 (×2): 10 mg via ORAL
  Filled 2012-01-21 (×4): qty 1

## 2012-01-21 MED ORDER — SODIUM CHLORIDE 0.9 % IV SOLN: INTRAVENOUS | Status: DC

## 2012-01-21 MED ORDER — CLOPIDOGREL BISULFATE 75 MG PO TABS
75.0000 mg | ORAL_TABLET | Freq: Every day | ORAL | Status: DC
  Administered 2012-01-22 – 2012-01-24 (×3): 75 mg via ORAL
  Filled 2012-01-21 (×5): qty 1

## 2012-01-21 MED ORDER — LEVOFLOXACIN IN D5W 750 MG/150ML IV SOLN
750.0000 mg | Freq: Once | INTRAVENOUS | Status: AC
  Administered 2012-01-21: 750 mg via INTRAVENOUS
  Filled 2012-01-21: qty 150

## 2012-01-21 MED ORDER — MEMANTINE HCL 10 MG PO TABS
10.0000 mg | ORAL_TABLET | Freq: Two times a day (BID) | ORAL | Status: DC
  Administered 2012-01-21 – 2012-01-24 (×5): 10 mg via ORAL
  Filled 2012-01-21 (×7): qty 1

## 2012-01-21 MED ORDER — SIMVASTATIN 20 MG PO TABS
20.0000 mg | ORAL_TABLET | Freq: Every day | ORAL | Status: DC
  Administered 2012-01-21 – 2012-01-22 (×2): 20 mg via ORAL
  Filled 2012-01-21 (×4): qty 1

## 2012-01-21 NOTE — Progress Notes (Signed)
ANTIBIOTIC CONSULT NOTE - INITIAL  Pharmacy Consult for:  Vancomycin, renal dose adjustment of antibiotics Indication:  Suspected pneumonia (HCAP)  Allergies  Allergen Reactions  . Aricept (Donepezil Hydrochloride)     unknown  . Aspirin   . Milk-Related Compounds     Patient Measurements: Height: 5\' 4"  (162.6 cm) Weight: 114 lb 6.7 oz (51.9 kg) IBW/kg (Calculated) : 54.7    Vital Signs: Temp: 98.1 F (36.7 C) (09/25 1702) Temp src: Oral (09/25 1702) BP: 100/43 mmHg (09/25 1702) Pulse Rate: 68  (09/25 1702)    Labs:  Basename 01/21/12 1415  WBC 5.4  HGB 13.2  PLT 128*  LABCREA --  CREATININE 1.04   Estimated Creatinine Clearance: 31.8 ml/min (by C-G formula based on Cr of 1.04).   Medical History: Past Medical History  Diagnosis Date  . Dementia   . Hypertension   . Thyroid disease   . Coronary artery disease   . Dementia   . Complication of anesthesia     blood pressure drops w/ anethesia    Medications:  Scheduled:    . ceFEPime (MAXIPIME) IV  1 g Intravenous Q8H  . cholecalciferol  1,000 Units Oral Daily  . clopidogrel  75 mg Oral Q breakfast  . enoxaparin (LOVENOX) injection  40 mg Subcutaneous Q24H  . levofloxacin (LEVAQUIN) IV  750 mg Intravenous Q24H  . levothyroxine  50 mcg Oral QAC breakfast  . loratadine  10 mg Oral QHS  . memantine  10 mg Oral BID  . QUEtiapine  25 mg Oral BID  . simvastatin  20 mg Oral QHS  . DISCONTD: sodium chloride   Intravenous STAT   Assessment:  Asked to assist with Vancomycin therapy for this 78 year-old female admitted with altered mental status and suspected pneumonia.  Also ordered Cefepime x 8 days and Levaquin x 3 days.  Antibiotic doses require adjustment due to renal insufficiency with CrCl 3l.8 ml/min.  Goals of Therapy:   Vancomycin trough levels 15-20 mcg/ml  Antibiotic doses appropriate for renal function  Eradication of infection  Plan:   Vancomycin 750 mg IV every 24 hours  Vancomycin  levels as needed to guide dosing  Adjusted antibiotic doses -- Cefepime 2 grams IV every 12 hours and Levaquin 750 mg every 48 hours.  Monitor for changes in renal function  [facility] R.Ph. 01/21/2012 7:15 PM

## 2012-01-21 NOTE — Progress Notes (Signed)
Nahla Lukin, is a 78 y.o. female,   MRN: [id number]  -  DOB - [birth year]  Outpatient Primary MD for the patient is Florentina Jenny, MD  in for    Chief Complaint  Patient presents with  . Altered Mental Status     Blood pressure 112/58, pulse 58, temperature 97.3 F (36.3 C), temperature source Rectal, resp. rate 24, SpO2 99.00%.  Principal Problem:  *Syncope Active Problems:  Dementia  Hypertension  Thyroid disease  Coronary artery disease  Altered mental status    77 yo with hx htn, dementia, CAD presents to ED via EMS from facility with cc ?syncope/ams. Info from EMS/staff. Reports pt in lunch room found slumped over plate.  Staff reports she was  Unresponsive at that time. ems arrived, could not palpate radial pulse or ausculate BP. Fluids given, placed in trendelenburg, BP 70pal at the scene. Pt arousable to voice upon ED arrival. Yells out when sternal rub is performed.   Work up in ED yields sod 128, HR 56-64, rectal temp 97.8 SBP 112. Chest xray yields mild patchy opacity at the lateral left lung base, atelectasis versus pneumonia.  Consider PA/lateral chest radiographs for further evaluation as clinically warranted. CT head results pending. Urine cloudy with small leuks, few bacteria few squamous cell, ABG yields ph 7.43 co2 39, 02 147 and bicarb 25. EKG with SR 1st degree block.   Given fluids in ED.   On exam pt alert oriented to self only. Sitting up in bed conversing with daughter. NAD. HR 60's. respers non-labored.   Will admit to tele.

## 2012-01-21 NOTE — H&P (Addendum)
[facility]          History and Physical    PCP:   Florentina Jenny, MD   Chief Complaint:  Unresponsive   HPI: 78 y/o woman sent from facility with the above complaint. Apparently she had whole body aches this am and was not able to attend breakfast. At lunchtime she was sitting at the table and suddenly slumped over. She was thought to not be breathing and CPR was commenced. On EMS arrival SBP was in the 70s. She was transferred to the hospital. She has had a quick recovery. BP has stabilized. I am seeing her up on the floor. She is now quite confused, which per her daughter is well beyond her baseline. She is picking at her tele wires and IV sites. A safety sitter is present. I am unable to obtain any history from her given her present mental state. She has what appears to be a PNA on CXR and we have been asked to admit her for further evaluation and management.  Allergies:   Allergies  Allergen Reactions  . Aricept (Donepezil Hydrochloride)     unknown  . Aspirin   . Milk-Related Compounds       Past Medical History  Diagnosis Date  . Dementia   . Hypertension   . Thyroid disease   . Coronary artery disease   . Dementia   . Complication of anesthesia     blood pressure drops w/ anethesia    Past Surgical History  Procedure Date  . Lumbar spine surgery   . Coronary artery bypass graft     3 bypasses  . Cholecystectomy   . Inner ear surgery     has metal in ear - no mri per daughter due to metal    Prior to Admission medications   Medication Sig Start Date End Date Taking? Authorizing Provider  acetaminophen (TYLENOL) 325 MG tablet Take 650 mg by mouth every 6 (six) hours as needed. For pain   Yes Historical Provider, MD  cetirizine (ZYRTEC) 10 MG tablet Take 10 mg by mouth at bedtime. Take for 7 days starting 03/03/11    Yes Historical Provider, MD  cholecalciferol (VITAMIN D) 1000 UNITS tablet Take 1,000 Units by mouth daily.    Yes Historical  Provider, MD  clopidogrel (PLAVIX) 75 MG tablet Take 75 mg by mouth daily.     Yes Historical Provider, MD  Ensure Plus (ENSURE PLUS) LIQD Take 237 mLs by mouth 2 (two) times daily between meals.   Yes Historical Provider, MD  hydrocortisone cream (PREPARATION H HYDROCORTISONE) 1 % Apply 1 application topically as needed. For hemorrhoid or rectal discomfort   Yes Historical Provider, MD  levothyroxine (SYNTHROID, LEVOTHROID) 75 MCG tablet Take 50 mcg by mouth daily.    Yes Historical Provider, MD  lisinopril-hydrochlorothiazide (PRINZIDE,ZESTORETIC) 20-25 MG per tablet Take 1 tablet by mouth daily.     Yes Historical Provider, MD  memantine (NAMENDA) 10 MG tablet Take 10 mg by mouth 2 (two) times daily.     Yes Historical Provider, MD  QUEtiapine (SEROQUEL) 25 MG tablet Take 25 mg by mouth 2 (two) times daily.    Yes Historical Provider, MD  simvastatin (ZOCOR) 20 MG tablet Take 20 mg by mouth at bedtime.     Yes Historical Provider, MD  guaiFENesin-dextromethorphan (ROBITUSSIN DM) 100-10 MG/5ML syrup Take 10 mLs by mouth every 4 (four) hours as needed. For cough     Historical Provider, MD    Social  History:  reports that she has never smoked. She has never used smokeless tobacco. She reports that she drinks alcohol. She reports that she does not use illicit drugs.  No family history on file.  Review of Systems:  Unable to obtain given encephalopathic state.  Physical Exam: Blood pressure 100/43, pulse 68, temperature 98.1 F (36.7 C), temperature source Oral, resp. rate 21, height 5\' 4"  (1.626 m), weight 51.9 kg (114 lb 6.7 oz), SpO2 100.00%. Gen: awake, confused not oriented. HEENT: [place]/AT/PERRL, Neck: supple, no JVD, no LAD, no bruits, no goiter. CV: RRR, no M/R/G. Lungs: decreased basilar base sounds. Abd: S/NT/ND/+BS/no masses or organomegaly noted. Ext: no C/C/E, +pedal pulses. Skin: no rash, wounds noted on exam.   Labs on Admission:  Results for orders placed during the  hospital encounter of 01/21/12 (from the past 48 hour(s))  CBC     Status: Abnormal   Collection Time   01/21/12  2:15 PM      Component Value Range Comment   WBC 5.4  4.0 - 10.5 K/uL    RBC 4.23  3.87 - 5.11 MIL/uL    Hemoglobin 13.2  12.0 - 15.0 g/dL    HCT 65.7  84.6 - 96.2 %    MCV 92.7  78.0 - 100.0 fL    MCH 31.2  26.0 - 34.0 pg    MCHC 33.7  30.0 - 36.0 g/dL    RDW 95.2  84.1 - 32.4 %    Platelets 128 (*) 150 - 400 K/uL   COMPREHENSIVE METABOLIC PANEL     Status: Abnormal   Collection Time   01/21/12  2:15 PM      Component Value Range Comment   Sodium 141  135 - 145 mEq/L    Potassium 3.6  3.5 - 5.1 mEq/L    Chloride 105  96 - 112 mEq/L    CO2 25  19 - 32 mEq/L    Glucose, Bld 137 (*) 70 - 99 mg/dL    BUN 24 (*) 6 - 23 mg/dL    Creatinine, Ser 4.01  0.50 - 1.10 mg/dL    Calcium 9.2  8.4 - 02.7 mg/dL    Total Protein 6.1  6.0 - 8.3 g/dL    Albumin 3.3 (*) 3.5 - 5.2 g/dL    AST 18  0 - 37 U/L    ALT 13  0 - 35 U/L    Alkaline Phosphatase 71  39 - 117 U/L    Total Bilirubin 0.6  0.3 - 1.2 mg/dL    GFR calc non Af Amer 47 (*) >90 mL/min    GFR calc Af Amer 55 (*) >90 mL/min   TROPONIN I     Status: Normal   Collection Time   01/21/12  2:15 PM      Component Value Range Comment   Troponin I <0.30  <0.30 ng/mL   URINALYSIS, ROUTINE W REFLEX MICROSCOPIC     Status: Abnormal   Collection Time   01/21/12  2:25 PM      Component Value Range Comment   Color, Urine YELLOW  YELLOW    APPearance CLOUDY (*) CLEAR    Specific Gravity, Urine 1.018  1.005 - 1.030    pH 7.0  5.0 - 8.0    Glucose, UA NEGATIVE  NEGATIVE mg/dL    Hgb urine dipstick NEGATIVE  NEGATIVE    Bilirubin Urine NEGATIVE  NEGATIVE    Ketones, ur NEGATIVE  NEGATIVE mg/dL    Protein,  ur NEGATIVE  NEGATIVE mg/dL    Urobilinogen, UA 2.0 (*) 0.0 - 1.0 mg/dL    Nitrite NEGATIVE  NEGATIVE    Leukocytes, UA SMALL (*) NEGATIVE   URINE MICROSCOPIC-ADD ON     Status: Abnormal   Collection Time   01/21/12  2:25 PM       Component Value Range Comment   Squamous Epithelial / LPF FEW (*) RARE    WBC, UA 0-2  <3 WBC/hpf    RBC / HPF 0-2  <3 RBC/hpf    Bacteria, UA FEW (*) RARE   BLOOD GAS, ARTERIAL     Status: Abnormal   Collection Time   01/21/12  2:42 PM      Component Value Range Comment   O2 Content 2.0      Delivery systems NASAL CANNULA      pH, Arterial 7.435  7.350 - 7.450    pCO2 arterial 39.0  35.0 - 45.0 mmHg    pO2, Arterial 147.0 (*) 80.0 - 100.0 mmHg    Bicarbonate 25.7 (*) 20.0 - 24.0 mEq/L    TCO2 22.9  0 - 100 mmol/L    Acid-Base Excess 2.0  0.0 - 2.0 mmol/L    O2 Saturation 98.9      Patient temperature 98.6      Collection site RIGHT RADIAL      Drawn by [id number]      Sample type ARTERIAL DRAW      Allens test (pass/fail) PASS  PASS     Radiological Exams on Admission: Dg Chest 1 View  01/21/2012  *RADIOLOGY REPORT*  Clinical Data: Respiratory arrest  CHEST - 1 VIEW  Comparison: 04/21/2011  Findings: Chronic interstitial markings/emphysematous changes. Mild patchy opacity at the lateral left lung base, atelectasis versus pneumonia. No pleural effusion or pneumothorax.  Cardiomediastinal silhouette is within normal limits. Postsurgical changes related to prior CABG.  Surgical clips in the upper abdomen.  IMPRESSION: Mild patchy opacity at the lateral left lung base, atelectasis versus pneumonia.  Consider PA/lateral chest radiographs for further evaluation as clinically warranted.   Original Report Authenticated By: Charline Bills, M.D.     Assessment/Plan Principal Problem:  *Sepsis Active Problems:  Dementia  Hypertension  Thyroid disease  Coronary artery disease  Altered mental status  Syncope  HCAP (healthcare-associated pneumonia)  Sepsis associated hypotension  Encephalopathy acute   Acute Encephalopathy -I believe likely 2/2 PNA on top of baseline dementia. -Continue safety sitter. -Try to avoid benzos and other sedating meds. -CVA unlikely given she is moving  all 4 extremities, but CT Head ordered.  HCAP -Given she is from a SNF will treat as HCAP with vanc/levaquin/cefepime. -Blood/sputum cx -strep pneumo/legionella urine antigens.  Sepsis associated Hypotension -BP has quickly improved with IVF. -Hold all BP meds. -Continue gentle IVF hydration. -Check lactic acid/procalcitonin.  Hypothyroidism -Check TSH. -Continue home synthroid dose.  DVT Prophylaxis -Lovenox.  Time Spent on Admission: 70 minutes  HERNANDEZ ACOSTA,ESTELA [facility] Pager: [phone] 01/21/2012, 6:28 PM

## 2012-01-21 NOTE — ED Notes (Signed)
[id number]<ZH> Expected date:<BR> Expected time:<BR> Means of arrival:<BR> Comments:<BR> Hypotension, responsive to pain

## 2012-01-21 NOTE — ED Notes (Signed)
Report called to Larene Pickett

## 2012-01-21 NOTE — Progress Notes (Signed)
pt listed [facility] pcp listed as henry tripp on facility info sheet

## 2012-01-21 NOTE — ED Notes (Addendum)
Pt she is more alert at this time, when her family came in the room and greeted her pt said to her daughter "33 you're here". When asked pt for her name she stated: "Why do you want to know my name?" Pt is alert and  oriented to person only. Will continue to monitor.

## 2012-01-21 NOTE — ED Provider Notes (Signed)
History     CSN: [id number]  Arrival date & time 01/21/12  1346   First MD Initiated Contact with Patient 01/21/12 1356      No chief complaint on file.   (Consider location/radiation/quality/duration/timing/severity/associated sxs/prior treatment) HPI Comments: Ms. Pelter presents via EMS for evaluation of altered mental status.  She was found slumped at her table during mealtime.  When staff tried to engage her she would not respond to their verbal commands or to touch.  She was reported to be hypotensive with systolic BPs as low as 70.  Per EMS, she has said several words and grunted en route to the ER.  They report improvement in her BP.  They deny any knowledge of any trauma, falls, fever, CP, SOB, NVD.  Patient is a 78 y.o. female presenting with altered mental status. The history is provided by the EMS personnel. The history is limited by the condition of the patient (pt has dementia and appears altered.  unsure of pt's baseline mental status).  Altered Mental Status This is a recurrent problem. The current episode started 1 to 2 hours ago. The problem has been gradually improving. Pertinent negatives include no chest pain, no abdominal pain and no shortness of breath. Nothing relieves the symptoms. She has tried nothing for the symptoms.    Past Medical History  Diagnosis Date  . Dementia   . Hypertension   . Thyroid disease   . Coronary artery disease   . Dementia     Past Surgical History  Procedure Date  . Lumbar spine surgery     No family history on file.  History  Substance Use Topics  . Smoking status: Never Smoker   . Smokeless tobacco: Not on file  . Alcohol Use: Yes     Occassional    OB History    Grav Para Term Preterm Abortions TAB SAB Ect Mult Living                  Review of Systems  Unable to perform ROS: Dementia  Respiratory: Negative for shortness of breath.   Cardiovascular: Negative for chest pain.  Gastrointestinal: Negative for  abdominal pain.  Psychiatric/Behavioral: Positive for altered mental status.    Allergies  Aricept; Aspirin; and Milk-related compounds  Home Medications   Current Outpatient Rx  Name Route Sig Dispense Refill  . ACETAMINOPHEN 325 MG PO TABS Oral Take 650 mg by mouth every 6 (six) hours as needed. For pain    . CETIRIZINE HCL 10 MG PO TABS Oral Take 10 mg by mouth at bedtime. Take for 7 days starting 03/03/11     . VITAMIN D 1000 UNITS PO TABS Oral Take 1,000 Units by mouth daily.     Marland Kitchen CLOPIDOGREL BISULFATE 75 MG PO TABS Oral Take 75 mg by mouth daily.      Marland Kitchen ENSURE PLUS PO LIQD Oral Take 237 mLs by mouth 2 (two) times daily between meals.    Marland Kitchen HYDROCORTISONE 1 % EX CREA Topical Apply 1 application topically as needed. For hemorrhoid or rectal discomfort    . LEVOTHYROXINE SODIUM 75 MCG PO TABS Oral Take 50 mcg by mouth daily.     Marland Kitchen LISINOPRIL-HYDROCHLOROTHIAZIDE 20-25 MG PO TABS Oral Take 1 tablet by mouth daily.      Marland Kitchen MEMANTINE HCL 10 MG PO TABS Oral Take 10 mg by mouth 2 (two) times daily.      . QUETIAPINE FUMARATE 25 MG PO TABS Oral Take 25 mg  by mouth 2 (two) times daily.     Marland Kitchen SIMVASTATIN 20 MG PO TABS Oral Take 20 mg by mouth at bedtime.      . GUAIFENESIN-DM 100-10 MG/5ML PO SYRP Oral Take 10 mLs by mouth every 4 (four) hours as needed. For cough       There were no vitals taken for this visit.  Physical Exam  Nursing note and vitals reviewed. Constitutional: She appears lethargic.  Non-toxic appearance. She has a sickly appearance. She appears ill. No distress. She is not intubated.  HENT:  Head: Normocephalic and atraumatic.  Right Ear: External ear normal.  Left Ear: External ear normal.  Nose: Nose normal.  Mouth/Throat: Oropharynx is clear and moist. No oropharyngeal exudate.  Eyes: Conjunctivae normal are normal. Right eye exhibits no discharge. Left eye exhibits no discharge. No scleral icterus.  Neck: Normal range of motion. Neck supple. JVD present. No spinous  process tenderness and no muscular tenderness present. Carotid bruit is not present. No rigidity. No tracheal deviation, no edema, no erythema and normal range of motion present. No thyromegaly present.  Cardiovascular: Intact distal pulses and normal pulses.   No extrasystoles are present. Bradycardia present.  PMI is not displaced.  Exam reveals no gallop, no distant heart sounds, no friction rub and no decreased pulses.   Murmur heard. Pulmonary/Chest: Effort normal. No accessory muscle usage or stridor. No apnea, not tachypneic and not bradypneic. She is not intubated. No respiratory distress. She has decreased breath sounds. She has no wheezes. She has no rhonchi. She has no rales.  Abdominal: Soft. Normal appearance and bowel sounds are normal. She exhibits no distension, no abdominal bruit, no ascites, no pulsatile midline mass and no mass. There is no tenderness. There is no rigidity, no rebound, no guarding, no CVA tenderness, no tenderness at McBurney's point and negative Murphy's sign. No hernia.  Musculoskeletal: Normal range of motion. She exhibits no edema and no tenderness.  Lymphadenopathy:    She has no cervical adenopathy.  Neurological: She appears lethargic. She displays no atrophy and no tremor. No cranial nerve deficit. She exhibits normal muscle tone. She displays no seizure activity. GCS eye subscore is 2. GCS verbal subscore is 3. GCS motor subscore is 5. She displays no Babinski's sign on the right side. She displays no Babinski's sign on the left side.       Pt is altered.  Words after introducing a noxious stimuli are clear and well formed - "Ouch!  That Hurts!  Stop!"  Skin: Skin is warm and dry. No rash noted. She is not diaphoretic. No erythema. No pallor.    ED Course  Procedures (including critical care time)   Labs Reviewed  CBC  COMPREHENSIVE METABOLIC PANEL  TSH  T4, FREE  URINALYSIS, ROUTINE W REFLEX MICROSCOPIC  TROPONIN I  BLOOD GAS, ARTERIAL   No  results found.   No diagnosis found.   Date: 01/21/2012  Rate: 51 bpm  Rhythm: sinus bradycardia  QRS Axis: normal  Intervals: PR prolonged  ST/T Wave abnormalities: normal  Conduction Disutrbances:first-degree A-V block   Narrative Interpretation:   Old EKG Reviewed: unchanged      MDM  Pt presents form a local nursing facility after having an unresponsive episode at lunch.  She was noted to be hypotensive en route to the ER.  She is currently holding her eyes closed (they open to noxious stimuli), note bradycardia, nl BP,  Nl O2 sat, NAD.  Will start a broad work-up  including basic labs, ABG, CXR, EKG, U/A, and head CT.  Will perform serial exams.  1530.  Pt is awake, interactive, and oriented at her baseline.  Her daughter is at the bedside.  Pt appears nontoxic, NAD.  Note essentially normal CBC, CMP, and U/A.  Trop is neg x1.  Awaiting CXR and CT of head.  Pt has no focal deficits on exam.  Plan obs admit for further evaluation as I cannot explain what precipitated today's event.      Tobin Chad, MD 01/21/12 1535

## 2012-01-21 NOTE — ED Notes (Signed)
Pt in from NH. Staff reports she became unresponsive. ems arrived, could not palpate radial pulse or ausculate BP. Fluids given, placed in trendelenburg, BP 70pal. Pt arousable to voice upon ED arrival. Yells out when sternal rub is performed.

## 2012-01-22 ENCOUNTER — Inpatient Hospital Stay (HOSPITAL_COMMUNITY): Payer: Medicare Other

## 2012-01-22 DIAGNOSIS — G9340 Encephalopathy, unspecified: Secondary | ICD-10-CM

## 2012-01-22 DIAGNOSIS — A419 Sepsis, unspecified organism: Principal | ICD-10-CM

## 2012-01-22 DIAGNOSIS — J189 Pneumonia, unspecified organism: Secondary | ICD-10-CM

## 2012-01-22 DIAGNOSIS — F0390 Unspecified dementia without behavioral disturbance: Secondary | ICD-10-CM

## 2012-01-22 DIAGNOSIS — R55 Syncope and collapse: Secondary | ICD-10-CM

## 2012-01-22 DIAGNOSIS — E876 Hypokalemia: Secondary | ICD-10-CM

## 2012-01-22 LAB — BASIC METABOLIC PANEL
BUN: 15 mg/dL (ref 6–23)
Creatinine, Ser: 0.96 mg/dL (ref 0.50–1.10)
GFR calc Af Amer: 60 mL/min — ABNORMAL LOW (ref >90)
GFR calc non Af Amer: 52 mL/min — ABNORMAL LOW (ref >90)
Glucose, Bld: 91 mg/dL (ref 70–99)

## 2012-01-22 LAB — TSH: TSH: 0.578 u[IU]/mL (ref 0.350–4.500)

## 2012-01-22 LAB — CBC
MCH: 30.8 pg (ref 26.0–34.0)
MCHC: 33.7 g/dL (ref 30.0–36.0)
RDW: 12.3 % (ref 11.5–15.5)

## 2012-01-22 MED ORDER — ENSURE COMPLETE PO LIQD
237.0000 mL | Freq: Two times a day (BID) | ORAL | Status: DC
  Administered 2012-01-22 – 2012-01-24 (×4): 237 mL via ORAL

## 2012-01-22 MED ORDER — CHLORHEXIDINE GLUCONATE CLOTH 2 % EX PADS
6.0000 | MEDICATED_PAD | Freq: Every day | CUTANEOUS | Status: DC
  Administered 2012-01-22 – 2012-01-24 (×3): 6 via TOPICAL

## 2012-01-22 MED ORDER — POTASSIUM CHLORIDE CRYS ER 20 MEQ PO TBCR
40.0000 meq | EXTENDED_RELEASE_TABLET | Freq: Once | ORAL | Status: AC
  Administered 2012-01-22: 40 meq via ORAL
  Filled 2012-01-22: qty 2

## 2012-01-22 MED ORDER — MUPIROCIN 2 % EX OINT
1.0000 "application " | TOPICAL_OINTMENT | Freq: Two times a day (BID) | CUTANEOUS | Status: DC
  Administered 2012-01-22 – 2012-01-24 (×5): 1 via NASAL

## 2012-01-22 MED ORDER — DEXTROSE 5 % IV SOLN
2.0000 g | INTRAVENOUS | Status: DC
  Administered 2012-01-23 – 2012-01-24 (×2): 2 g via INTRAVENOUS
  Filled 2012-01-22 (×2): qty 2

## 2012-01-22 NOTE — Progress Notes (Addendum)
[facility]             Progress Note   Subjective: Appears calmer today. Safety sitter present. Still confused.  Objective: Vital signs in last 24 hours: Temp:  [97.3 F (36.3 C)-98.1 F (36.7 C)] 98.1 F (36.7 C) (09/26 0422) Pulse Rate:  [58-71] 71  (09/26 0422) Resp:  [16-24] 20  (09/26 0422) BP: (100-139)/(43-58) 130/58 mmHg (09/26 0422) SpO2:  [98 %-100 %] 98 % (09/26 0422) Weight:  [51.9 kg (114 lb 6.7 oz)] 51.9 kg (114 lb 6.7 oz) (09/25 1702) Weight change:     Intake/Output from previous day: 09/25 0701 - 09/26 0700 In: 775 [I.V.:575; IV Piggyback:200] Out: -      Physical Exam: General: Awake, confused. HEENT: No bruits, no goiter. Heart: Regular rate and rhythm, without murmurs, rubs, gallops. Lungs: Clear to auscultation bilaterally. Abdomen: Soft, nontender, nondistended, positive bowel sounds. Extremities: No clubbing cyanosis or edema with positive pedal pulses.    Lab Results: Basic Metabolic Panel:  Basename 01/22/12 0438 01/21/12 1855 01/21/12 1415  NA 140 -- 141  K 3.2* -- 3.6  CL 104 -- 105  CO2 25 -- 25  GLUCOSE 91 -- 137*  BUN 15 -- 24*  CREATININE 0.96 0.90 --  CALCIUM 9.3 -- 9.2  MG -- -- --  PHOS -- -- --   Liver Function Tests:  Basename 01/21/12 1415  AST 18  ALT 13  ALKPHOS 71  BILITOT 0.6  PROT 6.1  ALBUMIN 3.3*   CBC:  Basename 01/22/12 0438 01/21/12 1855  WBC 6.0 7.6  NEUTROABS -- --  HGB 12.5 13.1  HCT 37.1 38.5  MCV 91.4 91.4  PLT 131* 130*   Cardiac Enzymes:  Basename 01/21/12 1415  CKTOTAL --  CKMB --  CKMBINDEX --  TROPONINI <0.30   Thyroid Function Tests:  Basename 01/21/12 1415  TSH 0.578  T4TOTAL --  FREET4 1.54  T3FREE --  THYROIDAB --   Urinalysis:  Basename 01/21/12 1425  COLORURINE YELLOW  LABSPEC 1.018  PHURINE 7.0  GLUCOSEU NEGATIVE  HGBUR NEGATIVE  BILIRUBINUR NEGATIVE  KETONESUR NEGATIVE  PROTEINUR NEGATIVE  UROBILINOGEN 2.0*  NITRITE NEGATIVE    LEUKOCYTESUR SMALL*    Recent Results (from the past 240 hour(s))  MRSA PCR SCREENING     Status: Abnormal   Collection Time   01/21/12 11:54 PM      Component Value Range Status Comment   MRSA by PCR POSITIVE (*) NEGATIVE Final     Studies/Results: Dg Chest 1 View  01/21/2012  *RADIOLOGY REPORT*  Clinical Data: Respiratory arrest  CHEST - 1 VIEW  Comparison: 04/21/2011  Findings: Chronic interstitial markings/emphysematous changes. Mild patchy opacity at the lateral left lung base, atelectasis versus pneumonia. No pleural effusion or pneumothorax.  Cardiomediastinal silhouette is within normal limits. Postsurgical changes related to prior CABG.  Surgical clips in the upper abdomen.  IMPRESSION: Mild patchy opacity at the lateral left lung base, atelectasis versus pneumonia.  Consider PA/lateral chest radiographs for further evaluation as clinically warranted.   Original Report Authenticated By: Charline Bills, M.D.    Ct Head Wo Contrast  01/22/2012  *RADIOLOGY REPORT*  Clinical Data: Syncope.  Altered mental status.  CT HEAD WITHOUT CONTRAST  Technique:  Contiguous axial images were obtained from the base of the skull through the vertex without contrast.  Comparison: 04/21/2011  Findings: There is atrophy and chronic small vessel disease changes. No acute intracranial abnormality.  Specifically, no hemorrhage, hydrocephalus, mass lesion, acute infarction, or  significant intracranial injury.  No acute calvarial abnormality.  IMPRESSION: No acute intracranial abnormality.  Atrophy, chronic microvascular disease.   Original Report Authenticated By: Cyndie Chime, M.D.     Medications: Scheduled Meds:    . ceFEPime (MAXIPIME) IV  2 g Intravenous Q12H  . Chlorhexidine Gluconate Cloth  6 each Topical Q0600  . cholecalciferol  1,000 Units Oral Daily  . clopidogrel  75 mg Oral Q breakfast  . enoxaparin (LOVENOX) injection  40 mg Subcutaneous Q24H  . levofloxacin (LEVAQUIN) IV  750 mg  Intravenous Once  . levofloxacin (LEVAQUIN) IV  750 mg Intravenous Q48H  . levothyroxine  50 mcg Oral QAC breakfast  . loratadine  10 mg Oral QHS  . LORazepam  0.5 mg Intravenous Once  . memantine  10 mg Oral BID  . mupirocin ointment  1 application Nasal BID  . potassium chloride  40 mEq Oral Once  . QUEtiapine  25 mg Oral BID  . simvastatin  20 mg Oral QHS  . vancomycin  750 mg Intravenous Q24H  . DISCONTD: sodium chloride   Intravenous STAT  . DISCONTD: ceFEPime (MAXIPIME) IV  1 g Intravenous Q8H   Continuous Infusions:    . sodium chloride 50 mL/hr at 01/21/12 1830   PRN Meds:.acetaminophen, acetaminophen  Assessment/Plan:  Principal Problem:  *Sepsis Active Problems:  Dementia  Hypertension  Thyroid disease  Coronary artery disease  Altered mental status  Syncope  HCAP (healthcare-associated pneumonia)  Sepsis associated hypotension  Encephalopathy acute  Hypokalemia   Acute Encephalopathy  -I believe likely 2/2 PNA on top of baseline dementia.  -Improved today. -Continue safety sitter.  -Try to avoid benzos and other sedating meds.  -CVA unlikely given she is moving all 4 extremities, but CT Head ordered.   HCAP  -Given she is from a SNF will treat as HCAP with vanc/levaquin/cefepime.  -Blood/sputum cx  -strep pneumo/legionella urine antigens.   Sepsis associated Hypotension  -BP has quickly improved with IVF.  -Hold all BP meds.  -Continue gentle IVF hydration.  -Lactic acid/procalcitonin within normal limits.  Hypothyroidism  -Check TSH.  -Continue home synthroid dose.   Hypokalemia -Replete PO. -Recheck in am.  DVT Prophylaxis  -Lovenox.  Disposition -Would like to treat with IV antibiotics for at least 3 days prior to DC SNF. -Can consider DC SNF after 3 days as long as her confusion has improved.    Time spent coordinating care: 25 minutes.   LOS: 1 day   [facility] Pager: [phone] 01/22/2012,  9:00 AM

## 2012-01-22 NOTE — Progress Notes (Signed)
Pt is very confused and disoriented. Pt began to state "why am I here, something must be wrong?" RN continued to reorient pt to surroundings and situation. Pt responded "this just doesn't make any sense, the only way to get out of it is to kill myself. I'm going to shoot myself in the head." Notified NP on call of current situation, orders were given to place pt on suicide precautions. RN and nurse tech removed all potential harmful items out of pt's room and intiaited the suicide precaution documentation tool. Will continue to monitor pt.

## 2012-01-22 NOTE — Progress Notes (Signed)
INITIAL ADULT NUTRITION ASSESSMENT Date: 01/22/2012   Time: 12:29 PM Reason for Assessment: Nutrition Risk- weight loss and decreased appetite resulting in decreased po intake.  ASSESSMENT: Female 78 y.o.  Dx: Sepsis, PNA, acute encephalopathy,  Hx:  Past Medical History  Diagnosis Date  . Dementia   . Hypertension   . Thyroid disease   . Coronary artery disease   . Dementia   . Complication of anesthesia     blood pressure drops w/ anethesia   Past Surgical History  Procedure Date  . Lumbar spine surgery   . Coronary artery bypass graft     3 bypasses  . Cholecystectomy   . Inner ear surgery     has metal in ear - no mri per daughter due to metal    Related Meds:     . ceFEPime (MAXIPIME) IV  2 g Intravenous Q12H  . Chlorhexidine Gluconate Cloth  6 each Topical Q0600  . cholecalciferol  1,000 Units Oral Daily  . clopidogrel  75 mg Oral Q breakfast  . enoxaparin (LOVENOX) injection  40 mg Subcutaneous Q24H  . levofloxacin (LEVAQUIN) IV  750 mg Intravenous Once  . levofloxacin (LEVAQUIN) IV  750 mg Intravenous Q48H  . levothyroxine  50 mcg Oral QAC breakfast  . loratadine  10 mg Oral QHS  . LORazepam  0.5 mg Intravenous Once  . memantine  10 mg Oral BID  . mupirocin ointment  1 application Nasal BID  . potassium chloride  40 mEq Oral Once  . QUEtiapine  25 mg Oral BID  . simvastatin  20 mg Oral QHS  . vancomycin  750 mg Intravenous Q24H  . DISCONTD: sodium chloride   Intravenous STAT  . DISCONTD: ceFEPime (MAXIPIME) IV  1 g Intravenous Q8H     Ht: 5\' 4"  (162.6 cm)  Wt: 114 lb 6.7 oz (51.9 kg)  Ideal Wt: 54.7 kg  % Ideal Wt: 95  Usual Wt: 145# October 2012 Wt Readings from Last 10 Encounters:  01/21/12 114 lb 6.7 oz (51.9 kg)  01/22/11 140 lb (63.504 kg)   % Usual Wt: 79  Body mass index is 19.64 kg/(m^2).  Labs:  CMP     Component Value Date/Time   NA 140 01/22/2012 0438   K 3.2* 01/22/2012 0438   CL 104 01/22/2012 0438   CO2 25 01/22/2012 0438     GLUCOSE 91 01/22/2012 0438   BUN 15 01/22/2012 0438   CREATININE 0.96 01/22/2012 0438   CALCIUM 9.3 01/22/2012 0438   PROT 6.1 01/21/2012 1415   ALBUMIN 3.3* 01/21/2012 1415   AST 18 01/21/2012 1415   ALT 13 01/21/2012 1415   ALKPHOS 71 01/21/2012 1415   BILITOT 0.6 01/21/2012 1415   GFRNONAA 52* 01/22/2012 0438   GFRAA 60* 01/22/2012 0438    I/O last 3 completed shifts: In: 775 [I.V.:575; IV Piggyback:200] Out: -  Total I/O In: 170 [P.O.:120; IV Piggyback:50] Out: -    Diet Order: General  Supplements/Tube Feeding:  none  IVF:    sodium chloride Last Rate: 50 mL/hr at 01/21/12 1830    Estimated Nutritional Needs:   Kcal: 1400-1600 Protein: 65-75 gm Fluid: >1.4L Food/Nutrition Related Hx: pt confused.  Unable to obtain diet history.  Sitter in room.  Intake not adequate.  Weight loss of 21% in the past year.  Pt meet criteria for severe malnutrition related to chronic illness AEB weight loss of 21% in the past year.  Decreased intake, decreased muscle mass.  NUTRITION  DIAGNOSIS: -Inadequate oral intake (NI-2.1).  Status: Ongoing  RELATED TO: confusion, decreased appetite  AS EVIDENCE BY: observation and documentation  MONITORING/EVALUATION(Goals): Goal:  Intake of >75% meals and supplements Monitor:  Intake, labs, weight  EDUCATION NEEDS: -No education needs identified at this time  INTERVENTION: Ensure complete bid  Dietitian #:Laura Derrell Lolling, RD, LDN Clinical Inpatient Dietitian Pager:  [phone] Weekend and after hours pager:  [phone]    DOCUMENTATION CODES Per approved criteria  -Severe malnutrition in the context of chronic illness    Jeoffrey Massed 01/22/2012, 12:29 PM

## 2012-01-22 NOTE — Care Management Note (Unsigned)
    Page 1 of 1   01/22/2012     11:57:31 AM   CARE MANAGEMENT NOTE 01/22/2012  Patient:  [facility]   Account Number:  [id number]  Date Initiated:  01/22/2012  Documentation initiated by:  Lanier Clam  Subjective/Objective Assessment:   ADMITTED W/UNRESPONSIVE.PNA.UX:[id number]     Action/Plan:   FROM [facility].   Anticipated DC Date:  01/27/2012   Anticipated DC Plan:  ASSISTED LIVING / REST HOME  In-house referral  Clinical Social Worker      DC Planning Services  CM consult      Choice offered to / List presented to:             Status of service:  In process, will continue to follow Medicare Important Message given?  NA - LOS <3 / Initial given by admissions (If response is "NO", the following Medicare IM given date fields will be blank) Date Medicare IM given:   Date Additional Medicare IM given:    Discharge Disposition:    Per UR Regulation:  Reviewed for med. necessity/level of care/duration of stay  If discussed at Long Length of Stay Meetings, dates discussed:    Comments:  01/22/12 Aman Bonet RN,BSN NCM [phone] SAFETY SITTER.RECOMMEND PT/OT EVAL WHEN MD FEEL APPROPRIATE.

## 2012-01-23 DIAGNOSIS — A419 Sepsis, unspecified organism: Secondary | ICD-10-CM

## 2012-01-23 DIAGNOSIS — E079 Disorder of thyroid, unspecified: Secondary | ICD-10-CM

## 2012-01-23 LAB — BASIC METABOLIC PANEL
BUN: 10 mg/dL (ref 6–23)
Chloride: 106 mEq/L (ref 96–112)
Creatinine, Ser: 0.83 mg/dL (ref 0.50–1.10)
GFR calc Af Amer: 72 mL/min — ABNORMAL LOW (ref >90)
GFR calc non Af Amer: 62 mL/min — ABNORMAL LOW (ref >90)
Glucose, Bld: 99 mg/dL (ref 70–99)
Potassium: 3.4 mEq/L — ABNORMAL LOW (ref 3.5–5.1)

## 2012-01-23 LAB — LEGIONELLA ANTIGEN, URINE: Legionella Antigen, Urine: NEGATIVE

## 2012-01-23 MED ORDER — POTASSIUM CHLORIDE CRYS ER 20 MEQ PO TBCR
40.0000 meq | EXTENDED_RELEASE_TABLET | Freq: Once | ORAL | Status: AC
  Administered 2012-01-23: 40 meq via ORAL
  Filled 2012-01-23: qty 2

## 2012-01-23 MED ORDER — LEVOFLOXACIN 500 MG PO TABS: 500.0000 mg | ORAL_TABLET | Freq: Every day | ORAL | Status: DC

## 2012-01-23 NOTE — Progress Notes (Signed)
[facility]             Progress Note   Subjective: Still confused. Calm. "I feel lousy!", but cannot expand on this.  Objective: Vital signs in last 24 hours: Temp:  [97.4 F (36.3 C)-100.8 F (38.2 C)] 98.1 F (36.7 C) (09/27 0555) Pulse Rate:  [70-100] 77  (09/27 0555) Resp:  [16-20] 16  (09/27 0555) BP: (117-134)/(57-69) 134/64 mmHg (09/27 0555) SpO2:  [97 %-100 %] 99 % (09/27 0555) Weight change:  Last BM Date: 01/21/12  Intake/Output from previous day: 09/26 0701 - 09/27 0700 In: 1930 [P.O.:480; I.V.:1250; IV Piggyback:200] Out: 600 [Urine:600]     Physical Exam: General: Awake, confused. HEENT: No bruits, no goiter. Heart: Regular rate and rhythm, without murmurs, rubs, gallops. Lungs: Clear to auscultation bilaterally. Abdomen: Soft, nontender, nondistended, positive bowel sounds. Extremities: No clubbing cyanosis or edema with positive pedal pulses.    Lab Results: Basic Metabolic Panel:  Basename 01/23/12 0705 01/22/12 0438  NA 141 140  K 3.4* 3.2*  CL 106 104  CO2 27 25  GLUCOSE 99 91  BUN 10 15  CREATININE 0.83 0.96  CALCIUM 9.3 9.3  MG -- --  PHOS -- --   Liver Function Tests:  Basename 01/21/12 1415  AST 18  ALT 13  ALKPHOS 71  BILITOT 0.6  PROT 6.1  ALBUMIN 3.3*   CBC:  Basename 01/22/12 0438 01/21/12 1855  WBC 6.0 7.6  NEUTROABS -- --  HGB 12.5 13.1  HCT 37.1 38.5  MCV 91.4 91.4  PLT 131* 130*   Cardiac Enzymes:  Basename 01/21/12 1415  CKTOTAL --  CKMB --  CKMBINDEX --  TROPONINI <0.30   Thyroid Function Tests:  Basename 01/21/12 1415  TSH 0.578  T4TOTAL --  FREET4 1.54  T3FREE --  THYROIDAB --   Urinalysis:  Basename 01/21/12 1425  COLORURINE YELLOW  LABSPEC 1.018  PHURINE 7.0  GLUCOSEU NEGATIVE  HGBUR NEGATIVE  BILIRUBINUR NEGATIVE  KETONESUR NEGATIVE  PROTEINUR NEGATIVE  UROBILINOGEN 2.0*  NITRITE NEGATIVE  LEUKOCYTESUR SMALL*    Recent Results (from the past 240 hour(s))    CULTURE, BLOOD (ROUTINE X 2)     Status: Normal (Preliminary result)   Collection Time   01/21/12  6:55 PM      Component Value Range Status Comment   Specimen Description BLOOD RIGHT ARM   Final    Special Requests BOTTLES DRAWN AEROBIC AND ANAEROBIC 6CC   Final    Culture  Setup Time 01/22/2012 00:53   Final    Culture     Final    Value:        BLOOD CULTURE RECEIVED NO GROWTH TO DATE CULTURE WILL BE HELD FOR 5 DAYS BEFORE ISSUING A FINAL NEGATIVE REPORT   Report Status PENDING   Incomplete   CULTURE, BLOOD (ROUTINE X 2)     Status: Normal (Preliminary result)   Collection Time   01/21/12  7:00 PM      Component Value Range Status Comment   Specimen Description BLOOD RIGHT ARM   Final    Special Requests BOTTLES DRAWN AEROBIC AND ANAEROBIC 5CC   Final    Culture  Setup Time 01/22/2012 00:53   Final    Culture     Final    Value:        BLOOD CULTURE RECEIVED NO GROWTH TO DATE CULTURE WILL BE HELD FOR 5 DAYS BEFORE ISSUING A FINAL NEGATIVE REPORT   Report Status PENDING  Incomplete   MRSA PCR SCREENING     Status: Abnormal   Collection Time   01/21/12 11:54 PM      Component Value Range Status Comment   MRSA by PCR POSITIVE (*) NEGATIVE Final     Studies/Results: Dg Chest 1 View  01/21/2012  *RADIOLOGY REPORT*  Clinical Data: Respiratory arrest  CHEST - 1 VIEW  Comparison: 04/21/2011  Findings: Chronic interstitial markings/emphysematous changes. Mild patchy opacity at the lateral left lung base, atelectasis versus pneumonia. No pleural effusion or pneumothorax.  Cardiomediastinal silhouette is within normal limits. Postsurgical changes related to prior CABG.  Surgical clips in the upper abdomen.  IMPRESSION: Mild patchy opacity at the lateral left lung base, atelectasis versus pneumonia.  Consider PA/lateral chest radiographs for further evaluation as clinically warranted.   Original Report Authenticated By: Charline Bills, M.D.    Ct Head Wo Contrast  01/22/2012  *RADIOLOGY  REPORT*  Clinical Data: Syncope.  Altered mental status.  CT HEAD WITHOUT CONTRAST  Technique:  Contiguous axial images were obtained from the base of the skull through the vertex without contrast.  Comparison: 04/21/2011  Findings: There is atrophy and chronic small vessel disease changes. No acute intracranial abnormality.  Specifically, no hemorrhage, hydrocephalus, mass lesion, acute infarction, or significant intracranial injury.  No acute calvarial abnormality.  IMPRESSION: No acute intracranial abnormality.  Atrophy, chronic microvascular disease.   Original Report Authenticated By: Cyndie Chime, M.D.     Medications: Scheduled Meds:    . ceFEPime (MAXIPIME) IV  2 g Intravenous Q24H  . Chlorhexidine Gluconate Cloth  6 each Topical Q0600  . cholecalciferol  1,000 Units Oral Daily  . clopidogrel  75 mg Oral Q breakfast  . enoxaparin (LOVENOX) injection  40 mg Subcutaneous Q24H  . feeding supplement  237 mL Oral BID BM  . levofloxacin (LEVAQUIN) IV  750 mg Intravenous Q48H  . levothyroxine  50 mcg Oral QAC breakfast  . loratadine  10 mg Oral QHS  . memantine  10 mg Oral BID  . mupirocin ointment  1 application Nasal BID  . potassium chloride  40 mEq Oral Once  . QUEtiapine  25 mg Oral BID  . simvastatin  20 mg Oral QHS  . vancomycin  750 mg Intravenous Q24H  . DISCONTD: ceFEPime (MAXIPIME) IV  2 g Intravenous Q12H   Continuous Infusions:    . sodium chloride 50 mL/hr at 01/23/12 0407   PRN Meds:.acetaminophen, acetaminophen  Assessment/Plan:  Principal Problem:  *Sepsis Active Problems:  Dementia  Hypertension  Thyroid disease  Coronary artery disease  Altered mental status  Syncope  HCAP (healthcare-associated pneumonia)  Sepsis associated hypotension  Encephalopathy acute  Hypokalemia   Acute Encephalopathy  -I believe likely 2/2 PNA on top of baseline dementia.  -Improved today. -Continue safety sitter.  -Try to avoid benzos and other sedating meds.  -CT  Head negative: she is now >48h out of her acute event, so if she had a new CVA it would have probably shown on CT. No need for MRI.  HCAP  -Given she is from a SNF will treat as HCAP with vanc/levaquin/cefepime for at least 3 days. -All cx data negative to date.  Sepsis associated Hypotension  -BP has quickly improved with IVF.  -Hold all BP meds.  -Continue gentle IVF hydration.  -Lactic acid/procalcitonin within normal limits.  Hypothyroidism  -TSH normal.  -Continue home synthroid dose.   Hypokalemia -Replete PO. -Recheck in am.  DVT Prophylaxis  -Lovenox.  Disposition -Would  like to treat with IV antibiotics for at least 3 days prior to DC SNF. -Can consider DC SNF after 3 days (tomorrow) as long as her confusion has improved (is close to her baseline at his point).    Time spent coordinating care: 25 minutes.   LOS: 2 days   [facility] Pager: [phone] 01/23/2012, 9:51 AM

## 2012-01-23 NOTE — Clinical Social Work Psychosocial (Unsigned)
     Clinical Social Work Department BRIEF PSYCHOSOCIAL ASSESSMENT 01/23/2012  Patient:  [facility]     Account Number:  [id number]     Admit date:  01/21/2012  Clinical Social Worker:  Hattie Perch  Date/Time:  01/23/2012 12:00 M  Referred by:  Physician  Date Referred:  01/23/2012 Referred for  ALF Placement   Other Referral:   Interview type:  Family Other interview type:    PSYCHOSOCIAL DATA Living Status:  FACILITY Admitted from facility:  [facility] Level of care:  Assisted Living Primary support name:  margaret villani Primary support relationship to patient:  CHILD, ADULT Degree of support available:   good    CURRENT CONCERNS Current Concerns  Post-Acute Placement  Post-Acute Placement   Other Concerns:    SOCIAL WORK ASSESSMENT / PLAN CSW met with patient. patient is confused and disoriented. patient is from [facility] assisted [facility] and will return on saturday. CSW spoke with [facility] and faxed meds. because they have the meds today, patient can return on saturday.   Assessment/plan status:   Other assessment/ plan:   Information/referral to community resources:    PATIENTS/FAMILYS RESPONSE TO PLAN OF CARE: patient to return to [facility] on saturday.

## 2012-01-24 LAB — STREP PNEUMONIAE URINARY ANTIGEN: Strep Pneumo Urinary Antigen: NEGATIVE

## 2012-01-24 LAB — BASIC METABOLIC PANEL
CO2: 25 mEq/L (ref 19–32)
Chloride: 106 mEq/L (ref 96–112)
GFR calc Af Amer: 59 mL/min — ABNORMAL LOW (ref >90)
GFR calc non Af Amer: 51 mL/min — ABNORMAL LOW (ref >90)

## 2012-01-24 LAB — CBC
Hemoglobin: 12.2 g/dL (ref 12.0–15.0)
MCH: 30.9 pg (ref 26.0–34.0)
RBC: 3.95 MIL/uL (ref 3.87–5.11)

## 2012-01-24 NOTE — Discharge Summary (Signed)
Physician Discharge Summary  Patient ID: Lindsay Evans MRN: [id number] DOB/AGE: November 26, 1923 78 y.o.  Admit date: 01/21/2012 Discharge date: 01/24/2012  Primary Care Physician:  Florentina Jenny, MD   Discharge Diagnoses:    Principal Problem:  *Sepsis Active Problems:  Dementia  Hypertension  Thyroid disease  Coronary artery disease  Altered mental status  Syncope  HCAP (healthcare-associated pneumonia)  Sepsis associated hypotension  Encephalopathy acute  Hypokalemia      Medication List     As of 01/24/2012  8:56 AM    STOP taking these medications         lisinopril-hydrochlorothiazide 20-25 MG per tablet   Commonly known as: PRINZIDE,ZESTORETIC      TAKE these medications         acetaminophen 325 MG tablet   Commonly known as: TYLENOL   Take 650 mg by mouth every 6 (six) hours as needed. For pain      cetirizine 10 MG tablet   Commonly known as: ZYRTEC   Take 10 mg by mouth at bedtime. Take for 7 days starting 03/03/11      cholecalciferol 1000 UNITS tablet   Commonly known as: VITAMIN D   Take 1,000 Units by mouth daily.      clopidogrel 75 MG tablet   Commonly known as: PLAVIX   Take 75 mg by mouth daily.      Ensure Plus Liqd   Take 237 mLs by mouth 2 (two) times daily between meals.      guaiFENesin-dextromethorphan 100-10 MG/5ML syrup   Commonly known as: ROBITUSSIN DM   Take 10 mLs by mouth every 4 (four) hours as needed. For cough      levofloxacin 500 MG tablet   Commonly known as: LEVAQUIN   Take 1 tablet (500 mg total) by mouth daily.      levothyroxine 75 MCG tablet   Commonly known as: SYNTHROID, LEVOTHROID   Take 50 mcg by mouth daily.      memantine 10 MG tablet   Commonly known as: NAMENDA   Take 10 mg by mouth 2 (two) times daily.      PREPARATION H HYDROCORTISONE 1 %   Generic drug: hydrocortisone cream   Apply 1 application topically as needed. For hemorrhoid or rectal discomfort      QUEtiapine 25 MG tablet   Commonly  known as: SEROQUEL   Take 25 mg by mouth 2 (two) times daily.      simvastatin 20 MG tablet   Commonly known as: ZOCOR   Take 20 mg by mouth at bedtime.         Disposition and Follow-up:  Will be discharged to ALF today in stable and improved condition. Will need to complete a course of levaquin for her PNA.  Consults:  None   Significant Diagnostic Studies:  CXR 9/25: IMPRESSION:  Mild patchy opacity at the lateral left lung base, atelectasis  versus pneumonia.  CT Head 9/26: IMPRESSION:  No acute intracranial abnormality.  Atrophy, chronic microvascular disease.   Brief H and P: For complete details please refer to admission H and P, but in brief patient is an 78 y/o woman sent from her facility for unresponsiveness. Apparently she had whole body aches the morning of admission and was not able to attend breakfast. At lunchtime she was sitting at the table and suddenly slumped over. She was thought to not be breathing and CPR was commenced. On EMS arrival SBP was in the 70s. She was transferred to  the hospital. She has had a quick recovery. BP has stabilized. I am seeing her up on the floor. She is now quite confused, which per her daughter is well beyond her baseline. She is picking at her tele wires and IV sites. A safety sitter is present. I am unable to obtain any history from her given her present mental state. She has what appears to be a PNA on CXR and we have been asked to admit her for further evaluation and management.     Hospital Course:  Principal Problem:  *Sepsis Active Problems:  Dementia  Hypertension  Thyroid disease  Coronary artery disease  Altered mental status  Syncope  HCAP (healthcare-associated pneumonia)  Sepsis associated hypotension  Encephalopathy acute  Hypokalemia  Acute Encephalopathy  -I believe likely 2/2 PNA on top of baseline dementia.  -She is now at her baseline mental status.  -Try to avoid benzos and other sedating meds.    -CT Head negative for acute event.  HCAP  -Given she is from a SNF she was treated as HCAP with vanc/levaquin/cefepime for  3 days.  -All cx data negative to date.  -Will treat with levaquin for an additional 5 days. -Never had fever or leukocytosis.  Sepsis associated Hypotension  -BP has quickly improved with IVF.  -Will continue to hold  BP meds at time of DC. -Lactic acid/procalcitonin within normal limits.   Hypothyroidism  -TSH normal.  -Continue home synthroid dose.   Hypokalemia  -Repleted. -Mag level within normal limits.    Time spent on Discharge: Greater than 30 minutes.  SignedChaya Jan [facility] Pager: [phone] 01/24/2012, 8:56 AM

## 2012-01-24 NOTE — Progress Notes (Signed)
Pt to be d/c today to [facility].  Pt and family agreeable. Confirmed plans with facility.  Plan transfer via family.   Leron Croak, LCSWA [employer] [phone]

## 2012-01-26 MED ORDER — LEVOFLOXACIN 500 MG PO TABS: 500.0000 mg | ORAL_TABLET | Freq: Every day | ORAL | Status: DC

## 2012-01-26 NOTE — Progress Notes (Addendum)
T/c from Clydie Braun at [facility], [phone], stating that the facility requested Pt's  Levaquin in hard-script form upon d/c; they did not receive this.  Clydie Braun requesting Pt's Levaquin in hard-script form.  Clydie Braun asking CSW to fax this script to facility: [phone] Notified MD.    MD provided CSW with a hard script of Levaquin.  CSW faxed this script to Clydie Braun at [facility].  Confirmed receipt by Clydie Braun at facility.  Clydie Braun not needing CSW to mail the script.  Providence Crosby, LCSWA Clinical Social Work [phone]

## 2012-01-28 LAB — CULTURE, BLOOD (ROUTINE X 2): Culture: NO GROWTH

## 2013-10-22 ENCOUNTER — Emergency Department (HOSPITAL_BASED_OUTPATIENT_CLINIC_OR_DEPARTMENT_OTHER)
Admission: EM | Admit: 2013-10-22 | Discharge: 2013-10-22 | Disposition: A | Discharge status: Home / Self Care | Payer: Medicare Other | Attending: Emergency Medicine | Admitting: Emergency Medicine

## 2013-10-22 ENCOUNTER — Encounter (HOSPITAL_BASED_OUTPATIENT_CLINIC_OR_DEPARTMENT_OTHER): Payer: Self-pay | Admitting: Emergency Medicine

## 2013-10-22 ENCOUNTER — Emergency Department (HOSPITAL_BASED_OUTPATIENT_CLINIC_OR_DEPARTMENT_OTHER): Payer: Medicare Other

## 2013-10-22 DIAGNOSIS — F0390 Unspecified dementia without behavioral disturbance: Secondary | ICD-10-CM | POA: Insufficient documentation

## 2013-10-22 DIAGNOSIS — I10 Essential (primary) hypertension: Secondary | ICD-10-CM | POA: Insufficient documentation

## 2013-10-22 DIAGNOSIS — Z23 Encounter for immunization: Secondary | ICD-10-CM | POA: Insufficient documentation

## 2013-10-22 DIAGNOSIS — E079 Disorder of thyroid, unspecified: Secondary | ICD-10-CM | POA: Insufficient documentation

## 2013-10-22 DIAGNOSIS — R296 Repeated falls: Secondary | ICD-10-CM | POA: Insufficient documentation

## 2013-10-22 DIAGNOSIS — Z951 Presence of aortocoronary bypass graft: Secondary | ICD-10-CM | POA: Insufficient documentation

## 2013-10-22 DIAGNOSIS — Y9389 Activity, other specified: Secondary | ICD-10-CM | POA: Insufficient documentation

## 2013-10-22 DIAGNOSIS — I2510 Atherosclerotic heart disease of native coronary artery without angina pectoris: Secondary | ICD-10-CM | POA: Insufficient documentation

## 2013-10-22 DIAGNOSIS — S0191XA Laceration without foreign body of unspecified part of head, initial encounter: Secondary | ICD-10-CM

## 2013-10-22 DIAGNOSIS — W19XXXA Unspecified fall, initial encounter: Secondary | ICD-10-CM

## 2013-10-22 DIAGNOSIS — S0190XA Unspecified open wound of unspecified part of head, initial encounter: Secondary | ICD-10-CM | POA: Insufficient documentation

## 2013-10-22 DIAGNOSIS — Y9289 Other specified places as the place of occurrence of the external cause: Secondary | ICD-10-CM | POA: Insufficient documentation

## 2013-10-22 DIAGNOSIS — Z79899 Other long term (current) drug therapy: Secondary | ICD-10-CM | POA: Insufficient documentation

## 2013-10-22 LAB — CBG MONITORING, ED: Glucose-Capillary: 116 mg/dL — ABNORMAL HIGH (ref 70–99)

## 2013-10-22 MED ORDER — TETANUS-DIPHTH-ACELL PERTUSSIS 5-2.5-18.5 LF-MCG/0.5 IM SUSP
0.5000 mL | Freq: Once | INTRAMUSCULAR | Status: AC
  Administered 2013-10-22: 0.5 mL via INTRAMUSCULAR
  Filled 2013-10-22: qty 0.5

## 2013-10-22 NOTE — ED Provider Notes (Signed)
CSN: [id number]     Arrival date & time 10/22/13  0630 History  This chart was scribed for Glynn Octave, MD by Shari Heritage, ED Scribe. The patient was seen in room MH09/MH09. Patient's care was started at 7:47 AM.   Chief Complaint  Patient presents with  . Fall    lac and abrasion to rt eye brow    Level 5 Caveat - Unable to obtain patient's complete history due to dementia. The history is provided by the EMS personnel and the patient. The history is limited by the condition of the patient. No language interpreter was used.   HPI Comments: Lindsay Evans is a 80 y.o. female who presents to the Emergency Department from [facility] after an un witnessed fall. Per EMS, patient was found on the floor and there was an abrasion and laceration to the right eye brow. Unknown if she lost consciousness after the fall. She states that she does not remember falling. Patient's family accompanies her to this visit and state her current mentation is baseline. Patient takes Plavix 75 mg daily.    Past Medical History  Diagnosis Date  . Dementia   . Hypertension   . Thyroid disease   . Coronary artery disease   . Dementia   . Complication of anesthesia     blood pressure drops w/ anethesia   Past Surgical History  Procedure Laterality Date  . Lumbar spine surgery    . Coronary artery bypass graft      3 bypasses  . Cholecystectomy    . Inner ear surgery      has metal in ear - no mri per daughter due to metal   No family history on file. History  Substance Use Topics  . Smoking status: Never Smoker   . Smokeless tobacco: Never Used  . Alcohol Use: Yes     Comment: Occassional   OB History   Grav Para Term Preterm Abortions TAB SAB Ect Mult Living                 Review of Systems Level 5 Caveat - Unable to complete full ROS due to patient's dementia.   Allergies  Aricept; Aspirin; and Milk-related compounds  Home Medications   Prior to Admission  medications   Medication Sig Start Date End Date Taking? Authorizing Provider  acetaminophen (TYLENOL) 325 MG tablet Take 650 mg by mouth every 6 (six) hours as needed. For pain    Historical Provider, MD  cetirizine (ZYRTEC) 10 MG tablet Take 10 mg by mouth at bedtime. Take for 7 days starting 03/03/11     Historical Provider, MD  cholecalciferol (VITAMIN D) 1000 UNITS tablet Take 1,000 Units by mouth daily.     Historical Provider, MD  clopidogrel (PLAVIX) 75 MG tablet Take 75 mg by mouth daily.      Historical Provider, MD  Ensure Plus (ENSURE PLUS) LIQD Take 237 mLs by mouth 2 (two) times daily between meals.    Historical Provider, MD  guaiFENesin-dextromethorphan (ROBITUSSIN DM) 100-10 MG/5ML syrup Take 10 mLs by mouth every 4 (four) hours as needed. For cough     Historical Provider, MD  hydrocortisone cream (PREPARATION H HYDROCORTISONE) 1 % Apply 1 application topically as needed. For hemorrhoid or rectal discomfort    Historical Provider, MD  levofloxacin (LEVAQUIN) 500 MG tablet Take 1 tablet (500 mg total) by mouth daily. 01/26/12   Henderson Cloud, MD  levothyroxine (SYNTHROID, LEVOTHROID) 75 MCG tablet  Take 50 mcg by mouth daily.     Historical Provider, MD  memantine (NAMENDA) 10 MG tablet Take 10 mg by mouth 2 (two) times daily.      Historical Provider, MD  QUEtiapine (SEROQUEL) 25 MG tablet Take 25 mg by mouth 2 (two) times daily.     Historical Provider, MD  simvastatin (ZOCOR) 20 MG tablet Take 20 mg by mouth at bedtime.      Historical Provider, MD   Triage Vitals: BP 156/68  Pulse 61  Temp(Src) 98 F (36.7 C) (Oral)  Resp 18  Ht 5\' 4"  (1.626 m)  Wt 120 lb (54.432 kg)  BMI 20.59 kg/m2  SpO2 97% Physical Exam  Nursing note and vitals reviewed. Constitutional: She appears well-developed and well-nourished. No distress.  HENT:  Head: Normocephalic. Head is with abrasion and with laceration.  Mouth/Throat: Oropharynx is clear and moist. No oropharyngeal exudate.   Abrasion to the right forehead. 2 separate 1 cm lacerations to the lateral right eyebrow.  Eyes: Conjunctivae and EOM are normal. Pupils are equal, round, and reactive to light.  Neck: Normal range of motion. Neck supple.  No C spine tenderness.  Cardiovascular: Normal rate, regular rhythm, normal heart sounds and intact distal pulses.   No murmur heard. Pulmonary/Chest: Effort normal and breath sounds normal. No respiratory distress.  Abdominal: Soft. There is no tenderness. There is no rebound and no guarding.  Musculoskeletal: Normal range of motion. She exhibits no edema and no tenderness.  Full ROM of the hips bilaterally.  Neurological: She is alert. No cranial nerve deficit. She exhibits normal muscle tone. Coordination normal.  Moving all extremities. Does not follow commands.   Skin: Skin is warm.  Psychiatric: She has a normal mood and affect. Her behavior is normal.    ED Course  Procedures (including critical care time) DIAGNOSTIC STUDIES: Oxygen Saturation is 97% on room air, adequate by my interpretation.    COORDINATION OF CARE: 7:53 AM- Will order imaging scans to rule out bleeding and repair laceration to right eyebrow. Family informed of current plan for treatment and evaluation and agrees with plan at this time.   LACERATION REPAIR PROCEDURE NOTE The patient's identification was confirmed and consent was obtained. This procedure was performed by Glynn Octave, MD at 7:54 AM. Site: lateral right eyebrow Sterile procedures observed Anesthetic used (type and amt): 2% lidocaine with epi Suture type/size:5-0 Ethilon Length:1 cm # of Sutures: 2 Technique:simple, interrupted Antibx ointment applied Tetanus ordered Site anesthetized, irrigated with NS, explored without evidence of foreign body, wound well approximated, site covered with dry, sterile dressing.  Patient tolerated procedure well without complications. Instructions for care discussed verbally and  patient provided with additional written instructions for homecare and f/u.  LACERATION REPAIR PROCEDURE NOTE The patient's identification was confirmed and consent was obtained. This procedure was performed by Glynn Octave, MD at 7:54 AM. Site: lateral right eyebrow Sterile procedures observed Anesthetic used (type and amt): 2% lidocaine with epi Suture type/size:5-0 Ethilon Length:1 cm # of Sutures: 1 Technique:simple, interrupted Antibx ointment applied Tetanus ordered Site anesthetized, irrigated with NS, explored without evidence of foreign body, wound well approximated, site covered with dry, sterile dressing.  Patient tolerated procedure well without complications. Instructions for care discussed verbally and patient provided with additional written instructions for homecare and f/u.   8:34 AM - Updated patient and family on imaging results. Family states that patient complained of dizziness when she tried to sit up. They further report that she has  a history of elevated blood sugar. Will give fluids and have patient try to sit up again.    Imaging Review Ct Head Wo Contrast  10/22/2013   CLINICAL DATA:  FALL  EXAM: CT HEAD WITHOUT CONTRAST  CT CERVICAL SPINE WITHOUT CONTRAST  TECHNIQUE: Multidetector CT imaging of the head and cervical spine was performed following the standard protocol without intravenous contrast. Multiplanar CT image reconstructions of the cervical spine were also generated.  COMPARISON:  01/22/2012  FINDINGS: CT HEAD FINDINGS  Atherosclerotic and physiologic intracranial calcifications. Diffuse parenchymal atrophy. Progressive areas of hypoattenuation in deep and periventricular white matter bilaterally. Negative for acute intracranial hemorrhage, mass lesion, acute infarction, midline shift, or mass-effect. Acute infarct may be inapparent on noncontrast CT. Ventricles and sulci symmetric. Bone windows demonstrate no focal lesion.  CT CERVICAL SPINE  FINDINGS  Normal alignment. No prevertebral soft tissue swelling. Facets are seated. There is mild narrowing of interspaces from C3-C7 with small posterior endplate spurs. Bilateral calcified carotid bifurcation plaque. Negative for fracture. Visualized lung apices clear.  IMPRESSION: 1. Negative for bleed or other acute intracranial process. 2. Atrophy and progressive nonspecific white matter changes. 3. Negative for cervical fracture or dislocation. 4. Spondylitic changes C3-C7 as above.   Electronically Signed   By: Oley Balmaniel  Hassell M.D.   On: 10/22/2013 07:56   Ct Cervical Spine Wo Contrast  10/22/2013   CLINICAL DATA:  FALL  EXAM: CT HEAD WITHOUT CONTRAST  CT CERVICAL SPINE WITHOUT CONTRAST  TECHNIQUE: Multidetector CT imaging of the head and cervical spine was performed following the standard protocol without intravenous contrast. Multiplanar CT image reconstructions of the cervical spine were also generated.  COMPARISON:  01/22/2012  FINDINGS: CT HEAD FINDINGS  Atherosclerotic and physiologic intracranial calcifications. Diffuse parenchymal atrophy. Progressive areas of hypoattenuation in deep and periventricular white matter bilaterally. Negative for acute intracranial hemorrhage, mass lesion, acute infarction, midline shift, or mass-effect. Acute infarct may be inapparent on noncontrast CT. Ventricles and sulci symmetric. Bone windows demonstrate no focal lesion.  CT CERVICAL SPINE FINDINGS  Normal alignment. No prevertebral soft tissue swelling. Facets are seated. There is mild narrowing of interspaces from C3-C7 with small posterior endplate spurs. Bilateral calcified carotid bifurcation plaque. Negative for fracture. Visualized lung apices clear.  IMPRESSION: 1. Negative for bleed or other acute intracranial process. 2. Atrophy and progressive nonspecific white matter changes. 3. Negative for cervical fracture or dislocation. 4. Spondylitic changes C3-C7 as above.   Electronically Signed   By: Oley Balmaniel   Hassell M.D.   On: 10/22/2013 07:56     EKG Interpretation   Date/Time: Saturday October 22 2013 08:16:11 EDT  Ventricular Rate: 59  PR Interval: 178  QRS Duration: 72  QT Interval: 426  QTC Calculation: 421  R Axis: 79  Text Interpretation: Sinus bradycardia Nonspecific ST and T wave  abnormality Abnormal ECG Artifact No significant change was found  Confirmed by Manus GunningANCOUR MD, STEPHEN [phone]([id number]) on 10/22/2013 8:18:57 AM    MDM   Final diagnoses:  Fall, initial encounter  Laceration of head, initial encounter   Unwitnessed fall with abrasion and laceration to right forehead. Patient with dementia, unable to give a history. Baseline per family.  CT head and C-spine negative. Wound repaired as above. Tetanus updated   EKG normal sinus rhythm   Family confirms patient is at her baseline mental status. She states patient has not been walking much over recent weeks it is mostly wheelchair-bound. She's been taking hydrocodone regularly at the nursing home.  She  will need suture removal in 1 week.  Follow up with PCP. Return precautions discussed.  I personally performed the services described in this documentation, which was scribed in my presence. The recorded information has been reviewed and is accurate.  Glynn OctaveStephen Rancour, MD 10/22/13 [phone]

## 2013-10-22 NOTE — Discharge Instructions (Signed)
Fall Prevention and Home Safety There is no evidence of serious head or neck injury. Followup in one week for suture removal. Return to the ED if you develop new or worsening symptoms. Falls cause injuries and can affect all age groups. It is possible to use preventive measures to significantly decrease the likelihood of falls. There are many simple measures which can make your home safer and prevent falls. OUTDOORS  Repair cracks and edges of walkways and driveways.  Remove high doorway thresholds.  Trim shrubbery on the main path into your home.  Have good outside lighting.  Clear walkways of tools, rocks, debris, and clutter.  Check that handrails are not broken and are securely fastened. Both sides of steps should have handrails.  Have leaves, snow, and ice cleared regularly.  Use sand or salt on walkways during winter months.  In the garage, clean up grease or oil spills. BATHROOM  Install night lights.  Install grab bars by the toilet and in the tub and shower.  Use non-skid mats or decals in the tub or shower.  Place a plastic non-slip stool in the shower to sit on, if needed.  Keep floors dry and clean up all water on the floor immediately.  Remove soap buildup in the tub or shower on a regular basis.  Secure bath mats with non-slip, double-sided rug tape.  Remove throw rugs and tripping hazards from the floors. BEDROOMS  Install night lights.  Make sure a bedside light is easy to reach.  Do not use oversized bedding.  Keep a telephone by your bedside.  Have a firm chair with side arms to use for getting dressed.  Remove throw rugs and tripping hazards from the floor. KITCHEN  Keep handles on pots and pans turned toward the center of the stove. Use back burners when possible.  Clean up spills quickly and allow time for drying.  Avoid walking on wet floors.  Avoid hot utensils and knives.  Position shelves so they are not too high or low.  Place  commonly used objects within easy reach.  If necessary, use a sturdy step stool with a grab bar when reaching.  Keep electrical cables out of the way.  Do not use floor polish or wax that makes floors slippery. If you must use wax, use non-skid floor wax.  Remove throw rugs and tripping hazards from the floor. STAIRWAYS  Never leave objects on stairs.  Place handrails on both sides of stairways and use them. Fix any loose handrails. Make sure handrails on both sides of the stairways are as long as the stairs.  Check carpeting to make sure it is firmly attached along stairs. Make repairs to worn or loose carpet promptly.  Avoid placing throw rugs at the top or bottom of stairways, or properly secure the rug with carpet tape to prevent slippage. Get rid of throw rugs, if possible.  Have an electrician put in a light switch at the top and bottom of the stairs. OTHER FALL PREVENTION TIPS  Wear low-heel or rubber-soled shoes that are supportive and fit well. Wear closed toe shoes.  When using a stepladder, make sure it is fully opened and both spreaders are firmly locked. Do not climb a closed stepladder.  Add color or contrast paint or tape to grab bars and handrails in your home. Place contrasting color strips on first and last steps.  Learn and use mobility aids as needed. Install an electrical emergency response system.  Turn on lights to  avoid dark areas. Replace light bulbs that burn out immediately. Get light switches that glow.  Arrange furniture to create clear pathways. Keep furniture in the same place.  Firmly attach carpet with non-skid or double-sided tape.  Eliminate uneven floor surfaces.  Select a carpet pattern that does not visually hide the edge of steps.  Be aware of all pets. OTHER HOME SAFETY TIPS  Set the water temperature for 120 F (48.8 C).  Keep emergency numbers on or near the telephone.  Keep smoke detectors on every level of the home and near  sleeping areas. Document Released: 04/04/2002 Document Revised: 10/14/2011 Document Reviewed: 07/04/2011 [facility] Patient Information 2015 [place]. This information is not intended to replace advice given to you by your health care provider. Make sure you discuss any questions you have with your health care provider.

## 2013-10-22 NOTE — ED Notes (Signed)
[facility]; pt found on floor, abrasion and lac to rt eye brow; unknown LOC, pt unable to answer questions; no distress noted

## 2013-10-22 NOTE — ED Notes (Signed)
Patient drank 6oz of cranberry juice.

## 2013-10-22 NOTE — ED Notes (Signed)
Attempted to ambulate, pt stated "I'm so dizzy" when she sat up in bed.

## 2013-10-22 NOTE — ED Notes (Signed)
Pt ambulated from room to nurses station with walker and assistance.

## 2014-03-24 ENCOUNTER — Encounter (HOSPITAL_COMMUNITY): Payer: Self-pay | Admitting: Emergency Medicine

## 2014-03-24 ENCOUNTER — Emergency Department (HOSPITAL_COMMUNITY): Payer: Medicare Other

## 2014-03-24 ENCOUNTER — Inpatient Hospital Stay (HOSPITAL_COMMUNITY)
Admission: EM | Admit: 2014-03-24 | Discharge: 2014-03-25 | DRG: 069 | Disposition: A | Discharge status: Skilled Nursing Facility | Payer: Medicare Other | Attending: Internal Medicine | Admitting: Internal Medicine

## 2014-03-24 ENCOUNTER — Inpatient Hospital Stay (HOSPITAL_COMMUNITY): Payer: Medicare Other

## 2014-03-24 DIAGNOSIS — Z951 Presence of aortocoronary bypass graft: Secondary | ICD-10-CM | POA: Diagnosis not present

## 2014-03-24 DIAGNOSIS — I4892 Unspecified atrial flutter: Secondary | ICD-10-CM | POA: Diagnosis present

## 2014-03-24 DIAGNOSIS — G459 Transient cerebral ischemic attack, unspecified: Secondary | ICD-10-CM | POA: Diagnosis present

## 2014-03-24 DIAGNOSIS — N390 Urinary tract infection, site not specified: Secondary | ICD-10-CM | POA: Diagnosis present

## 2014-03-24 DIAGNOSIS — I4891 Unspecified atrial fibrillation: Secondary | ICD-10-CM | POA: Diagnosis present

## 2014-03-24 DIAGNOSIS — E860 Dehydration: Secondary | ICD-10-CM | POA: Diagnosis present

## 2014-03-24 DIAGNOSIS — R471 Dysarthria and anarthria: Secondary | ICD-10-CM | POA: Diagnosis present

## 2014-03-24 DIAGNOSIS — F0390 Unspecified dementia without behavioral disturbance: Secondary | ICD-10-CM | POA: Diagnosis present

## 2014-03-24 DIAGNOSIS — R29810 Facial weakness: Secondary | ICD-10-CM | POA: Diagnosis present

## 2014-03-24 DIAGNOSIS — D72829 Elevated white blood cell count, unspecified: Secondary | ICD-10-CM | POA: Diagnosis present

## 2014-03-24 DIAGNOSIS — Z9049 Acquired absence of other specified parts of digestive tract: Secondary | ICD-10-CM | POA: Diagnosis present

## 2014-03-24 DIAGNOSIS — Z888 Allergy status to other drugs, medicaments and biological substances status: Secondary | ICD-10-CM

## 2014-03-24 DIAGNOSIS — R4701 Aphasia: Secondary | ICD-10-CM | POA: Insufficient documentation

## 2014-03-24 DIAGNOSIS — Z91011 Allergy to milk products: Secondary | ICD-10-CM

## 2014-03-24 DIAGNOSIS — Z79899 Other long term (current) drug therapy: Secondary | ICD-10-CM

## 2014-03-24 DIAGNOSIS — Z9620 Presence of otological and audiological implant, unspecified: Secondary | ICD-10-CM | POA: Diagnosis present

## 2014-03-24 DIAGNOSIS — Z66 Do not resuscitate: Secondary | ICD-10-CM | POA: Diagnosis present

## 2014-03-24 DIAGNOSIS — I63311 Cerebral infarction due to thrombosis of right middle cerebral artery: Secondary | ICD-10-CM

## 2014-03-24 DIAGNOSIS — I10 Essential (primary) hypertension: Secondary | ICD-10-CM | POA: Diagnosis present

## 2014-03-24 DIAGNOSIS — R569 Unspecified convulsions: Secondary | ICD-10-CM | POA: Diagnosis present

## 2014-03-24 DIAGNOSIS — I639 Cerebral infarction, unspecified: Secondary | ICD-10-CM

## 2014-03-24 DIAGNOSIS — I2510 Atherosclerotic heart disease of native coronary artery without angina pectoris: Secondary | ICD-10-CM | POA: Diagnosis present

## 2014-03-24 DIAGNOSIS — R55 Syncope and collapse: Secondary | ICD-10-CM | POA: Diagnosis present

## 2014-03-24 DIAGNOSIS — E039 Hypothyroidism, unspecified: Secondary | ICD-10-CM | POA: Diagnosis present

## 2014-03-24 DIAGNOSIS — R4182 Altered mental status, unspecified: Secondary | ICD-10-CM

## 2014-03-24 LAB — COMPREHENSIVE METABOLIC PANEL
ALT: 8 U/L (ref 0–35)
AST: 14 U/L (ref 0–37)
Albumin: 3.5 g/dL (ref 3.5–5.2)
Alkaline Phosphatase: 98 U/L (ref 39–117)
Anion gap: 14 (ref 5–15)
BUN: 12 mg/dL (ref 6–23)
CO2: 23 mEq/L (ref 19–32)
Calcium: 9.3 mg/dL (ref 8.4–10.5)
Chloride: 101 mEq/L (ref 96–112)
Creatinine, Ser: 1.16 mg/dL — ABNORMAL HIGH (ref 0.50–1.10)
GFR calc non Af Amer: 41 mL/min — ABNORMAL LOW (ref >90)
GFR, EST AFRICAN AMERICAN: 47 mL/min — AB (ref >90)
Glucose, Bld: 94 mg/dL (ref 70–99)
Potassium: 4.5 mEq/L (ref 3.7–5.3)
SODIUM: 138 meq/L (ref 137–147)
Total Bilirubin: 0.3 mg/dL (ref 0.3–1.2)
Total Protein: 7 g/dL (ref 6.0–8.3)

## 2014-03-24 LAB — HEMOGLOBIN A1C
HEMOGLOBIN A1C: 5 % (ref <5.7)
Mean Plasma Glucose: 97 mg/dL (ref <117)

## 2014-03-24 LAB — DIFFERENTIAL
BASOS ABS: 0 10*3/uL (ref 0.0–0.1)
BASOS PCT: 0 % (ref 0–1)
EOS PCT: 0 % (ref 0–5)
Eosinophils Absolute: 0.1 10*3/uL (ref 0.0–0.7)
Lymphocytes Relative: 27 % (ref 12–46)
Lymphs Abs: 3.5 10*3/uL (ref 0.7–4.0)
Monocytes Absolute: 1.2 10*3/uL — ABNORMAL HIGH (ref 0.1–1.0)
Monocytes Relative: 9 % (ref 3–12)
Neutro Abs: 8.1 10*3/uL — ABNORMAL HIGH (ref 1.7–7.7)
Neutrophils Relative %: 64 % (ref 43–77)

## 2014-03-24 LAB — CBC
HCT: 39.9 % (ref 36.0–46.0)
Hemoglobin: 13.1 g/dL (ref 12.0–15.0)
MCH: 31.3 pg (ref 26.0–34.0)
MCHC: 32.8 g/dL (ref 30.0–36.0)
MCV: 95.2 fL (ref 78.0–100.0)
Platelets: 145 10*3/uL — ABNORMAL LOW (ref 150–400)
RBC: 4.19 MIL/uL (ref 3.87–5.11)
RDW: 13.8 % (ref 11.5–15.5)
WBC: 12.8 10*3/uL — ABNORMAL HIGH (ref 4.0–10.5)

## 2014-03-24 LAB — CBG MONITORING, ED: Glucose-Capillary: 91 mg/dL (ref 70–99)

## 2014-03-24 LAB — I-STAT CHEM 8, ED
BUN: 11 mg/dL (ref 6–23)
CALCIUM ION: 1.24 mmol/L (ref 1.13–1.30)
Chloride: 103 mEq/L (ref 96–112)
Creatinine, Ser: 1.2 mg/dL — ABNORMAL HIGH (ref 0.50–1.10)
Glucose, Bld: 92 mg/dL (ref 70–99)
HEMATOCRIT: 42 % (ref 36.0–46.0)
Hemoglobin: 14.3 g/dL (ref 12.0–15.0)
Potassium: 4 mEq/L (ref 3.7–5.3)
Sodium: 139 mEq/L (ref 137–147)
TCO2: 23 mmol/L (ref 0–100)

## 2014-03-24 LAB — URINALYSIS, ROUTINE W REFLEX MICROSCOPIC
Bilirubin Urine: NEGATIVE
GLUCOSE, UA: NEGATIVE mg/dL
Hgb urine dipstick: NEGATIVE
KETONES UR: NEGATIVE mg/dL
Nitrite: NEGATIVE
PROTEIN: NEGATIVE mg/dL
Specific Gravity, Urine: 1.019 (ref 1.005–1.030)
UROBILINOGEN UA: 0.2 mg/dL (ref 0.0–1.0)
pH: 6 (ref 5.0–8.0)

## 2014-03-24 LAB — PROTIME-INR
INR: 1.06 (ref 0.00–1.49)
INR: 1.02 (ref 0.00–1.49)
Prothrombin Time: 13.9 seconds (ref 11.6–15.2)
Prothrombin Time: 13.5 seconds (ref 11.6–15.2)

## 2014-03-24 LAB — APTT: aPTT: 34 seconds (ref 24–37)

## 2014-03-24 LAB — I-STAT TROPONIN, ED: Troponin i, poc: 0 ng/mL (ref 0.00–0.08)

## 2014-03-24 LAB — URINE MICROSCOPIC-ADD ON

## 2014-03-24 LAB — TSH: TSH: 44.2 u[IU]/mL — AB (ref 0.350–4.500)

## 2014-03-24 MED ORDER — HEPARIN SODIUM (PORCINE) 5000 UNIT/ML IJ SOLN
5000.0000 [IU] | Freq: Three times a day (TID) | INTRAMUSCULAR | Status: DC
  Administered 2014-03-25: 5000 [IU] via SUBCUTANEOUS
  Filled 2014-03-24 (×2): qty 1

## 2014-03-24 MED ORDER — SULFAMETHOXAZOLE-TRIMETHOPRIM 800-160 MG PO TABS
1.0000 | ORAL_TABLET | Freq: Two times a day (BID) | ORAL | Status: DC
  Administered 2014-03-25: 1 via ORAL
  Filled 2014-03-24: qty 1

## 2014-03-24 MED ORDER — MEMANTINE HCL 5 MG PO TABS
5.0000 mg | ORAL_TABLET | Freq: Two times a day (BID) | ORAL | Status: DC
  Administered 2014-03-25: 5 mg via ORAL
  Filled 2014-03-24 (×3): qty 1

## 2014-03-24 MED ORDER — LEVOTHYROXINE SODIUM 25 MCG PO TABS
25.0000 ug | ORAL_TABLET | Freq: Every day | ORAL | Status: DC
  Administered 2014-03-25: 25 ug via ORAL
  Filled 2014-03-24: qty 1

## 2014-03-24 MED ORDER — DEXTROSE 5 % IV SOLN
1.0000 g | INTRAVENOUS | Status: DC
  Administered 2014-03-24: 1 g via INTRAVENOUS
  Filled 2014-03-24 (×2): qty 10

## 2014-03-24 MED ORDER — LORAZEPAM 0.5 MG PO TABS: 0.2500 mg | ORAL_TABLET | Freq: Four times a day (QID) | ORAL | Status: DC | PRN

## 2014-03-24 MED ORDER — VITAMIN B-12 1000 MCG PO TABS
1000.0000 ug | ORAL_TABLET | Freq: Every day | ORAL | Status: DC
  Administered 2014-03-25: 1000 ug via ORAL
  Filled 2014-03-24 (×2): qty 1

## 2014-03-24 MED ORDER — STROKE: EARLY STAGES OF RECOVERY BOOK
Freq: Once | Status: AC
  Administered 2014-03-25: 02:00:00
  Filled 2014-03-24: qty 1

## 2014-03-24 MED ORDER — LORATADINE 10 MG PO TABS
10.0000 mg | ORAL_TABLET | Freq: Every day | ORAL | Status: DC
  Administered 2014-03-25: 10 mg via ORAL
  Filled 2014-03-24: qty 1

## 2014-03-24 MED ORDER — CLOPIDOGREL BISULFATE 75 MG PO TABS
75.0000 mg | ORAL_TABLET | Freq: Every day | ORAL | Status: DC
  Administered 2014-03-25: 75 mg via ORAL
  Filled 2014-03-24: qty 1

## 2014-03-24 MED ORDER — SENNA 8.6 MG PO TABS
1.0000 | ORAL_TABLET | Freq: Two times a day (BID) | ORAL | Status: DC
Start: 2014-03-24 — End: 2014-03-25
  Administered 2014-03-25: 8.6 mg via ORAL
  Filled 2014-03-24 (×5): qty 1

## 2014-03-24 NOTE — Consult Note (Addendum)
Referring Physician: Dr. Patria Mane, ED    Chief Complaint: code stroke, right face weakness, dysarthria.  HPI:                                                                                                                                         Lindsay Evans is an 80 y.o. female with a past medical history significant for HTN, CAD s/p CABG, thyroid disease and advanced dementia, brought in as a code stroke via EMS due to acute onset of the above stated symptoms. Pt from [facility] and staff report that today while sitting in a wheelchair eating lunch she made a grouting and they noticed right face weakness and changes in her speech. She apparently is able to say only few words but can not complete a sentence. However, daughter is at the bedside and said that her speech was slurred and less understandable than usual. EMS did not notice changes in motor function. Urgent CT brain in the ED revealed no acute intracranial abnormality. NIHSS 7 It is worth mentioning that patient's symptoms are rapidly improving in the ED. Date last known well:  Time last known well:  tPA Given: no, symptoms rapidly improving and patient not a suitable candidate for thrombolysis due to severe dementia with poor functional status. NIHSS: 7   Past Medical History  Diagnosis Date  . Dementia   . Hypertension   . Thyroid disease   . Coronary artery disease   . Dementia   . Complication of anesthesia     blood pressure drops w/ anethesia    Past Surgical History  Procedure Laterality Date  . Lumbar spine surgery    . Coronary artery bypass graft      3 bypasses  . Cholecystectomy    . Inner ear surgery      has metal in ear - no mri per daughter due to metal    No family history on file. Social History:  reports that she has never smoked. She has never used smokeless tobacco. She reports that she drinks alcohol. She reports that she does not use illicit drugs.  Allergies:  Allergies  Allergen  Reactions  . Aricept [Donepezil Hydrochloride] Diarrhea  . Aspirin Other (See Comments)    unknown  . Milk-Related Compounds Other (See Comments)    Runny nose    Medications:  I have reviewed the patient's current medications.  ROS: unobtainable due to severe dementia                                                                                                         History obtained from family and chart review   Physical exam: pleasantly demented female in no apparent distress. Blood pressure 130/66, pulse 67, temperature 97.7 F (36.5 C), temperature source Axillary, resp. rate 14, SpO2 100 %. Head: normocephalic. Neck: supple, no bruits, no JVD. Cardiac: no murmurs. Lungs: clear. Abdomen: soft, no tender, no mass. Extremities: no edema. Neurologic Examination:                                                                                                      General: Mental Status: Alert and awake, disoriented and unable to follow commands. Mild dysarthria. Cranial Nerves: II: Discs flat bilaterally; Visual fields grossly normal, pupils equal, round, reactive to light and accommodation III,IV, VI: ptosis not present, extra-ocular motions intact bilaterally V,VII: smile asymmetric due to right face weakness, facial light touch sensation unreliable VIII: hearing normal bilaterally IX,X: gag reflex present XI: bilateral shoulder shrug XII: midline tongue extension without atrophy or fasciculations Motor: Moves all extremities symmetrically Tone and bulk:normal tone throughout; no atrophy noted Sensory: Pinprick and light touch intact throughout, bilaterally Deep Tendon Reflexes:  1+ all over Plantars: Right: downgoing   Left: downgoing Cerebellar: Doesn't follow commands for testing Gait:  Unable to test.     Results for orders placed or  performed during the hospital encounter of 03/24/14 (from the past 48 hour(s))  I-stat troponin, ED [facility])     Status: None   Collection Time: 03/24/14  3:01 PM  Result Value Ref Range   Troponin i, poc 0.00 0.00 - 0.08 ng/mL   Comment 3            Comment: Due to the release kinetics of cTnI, a negative result within the first hours of the onset of symptoms does not rule out myocardial infarction with certainty. If myocardial infarction is still suspected, repeat the test at appropriate intervals.   I-Stat Chem 8, ED     Status: Abnormal   Collection Time: 03/24/14  3:03 PM  Result Value Ref Range   Sodium 139 137 - 147 mEq/L   Potassium 4.0 3.7 - 5.3 mEq/L   Chloride 103 96 - 112 mEq/L   BUN 11 6 - 23 mg/dL   Creatinine, Ser 9.141.20 (H) 0.50 - 1.10 mg/dL   Glucose, Bld 92 70 - 99 mg/dL   Calcium, Ion 7.821.24 9.561.13 - 1.30 mmol/L  TCO2 23 0 - 100 mmol/L   Hemoglobin 14.3 12.0 - 15.0 g/dL   HCT 16.142.0 09.636.0 - 04.546.0 %  CBC     Status: Abnormal   Collection Time: 03/24/14  3:04 PM  Result Value Ref Range   WBC 12.8 (H) 4.0 - 10.5 K/uL   RBC 4.19 3.87 - 5.11 MIL/uL   Hemoglobin 13.1 12.0 - 15.0 g/dL   HCT 40.939.9 81.136.0 - 91.446.0 %   MCV 95.2 78.0 - 100.0 fL   MCH 31.3 26.0 - 34.0 pg   MCHC 32.8 30.0 - 36.0 g/dL   RDW 78.213.8 95.611.5 - 21.315.5 %   Platelets 145 (L) 150 - 400 K/uL  Differential     Status: Abnormal   Collection Time: 03/24/14  3:04 PM  Result Value Ref Range   Neutrophils Relative % 64 43 - 77 %   Neutro Abs 8.1 (H) 1.7 - 7.7 K/uL   Lymphocytes Relative 27 12 - 46 %   Lymphs Abs 3.5 0.7 - 4.0 K/uL   Monocytes Relative 9 3 - 12 %   Monocytes Absolute 1.2 (H) 0.1 - 1.0 K/uL   Eosinophils Relative 0 0 - 5 %   Eosinophils Absolute 0.1 0.0 - 0.7 K/uL   Basophils Relative 0 0 - 1 %   Basophils Absolute 0.0 0.0 - 0.1 K/uL   No results found.   Assessment: 80 y.o. female with advanced dementia and probable left brain subcortical infarct. Can not entirely ruled out a  seizure. NIHSS 7, CT brain without acute abnormality. Patient's symptoms rapidly improving in the ED. She is not a suitable candidate for thrombolysis due to advanced dementia with very limited functional status. Further, I am concerned that pursuing comprehensive stroke work up will not change management due to patient's severe dementia. Admit to medicine. Can not get MRI/MRA brain due to non MRI compatible otic implant . Get follow up CT brain in am, EEG, lipid profile Patient with reported allergy to aspirin, thus will start plavix. Stroke team will follow up in the morning.   Stroke Risk Factors - age, HTN, CAD   Plan: 1. HgbA1c, fasting lipid panel 2. CT brain in am 3. Echocardiogram 4. Carotid dopplers 5. Prophylactic therapy-plavix after passing swallowing evaluation 6. Risk factor modification 7. Telemetry monitoring 8. Frequent neuro checks 9. PT/OT SLP (patient unable to participate in therapy due to severe dementia)   Wyatt Portelasvaldo Camilo, MD [facility]

## 2014-03-24 NOTE — ED Notes (Signed)
Code stroke activated @ 1443

## 2014-03-24 NOTE — ED Notes (Addendum)
Pt back in room from CT and placed on heart monitor, Neuro team at bedside and Dr. Cyril Mourningamillo at bedside.

## 2014-03-24 NOTE — ED Notes (Signed)
EEG at bedside.

## 2014-03-24 NOTE — ED Notes (Addendum)
Family at bedside, states that pt does have a droop noted on right face, family also reports that pt speech does not sound as clear as normal, Dr. Patria Maneampos made aware and at bedside.

## 2014-03-24 NOTE — Progress Notes (Signed)
Patient is uncooperative with questionable cognition unable to perform accurate nero checks, additionally vitals a unstable with systolic BP dropping to 50's and 40's along with pulse below 50. Will notify Dr. Rennis Pettyf statis and continue to monitor.

## 2014-03-24 NOTE — Procedures (Signed)
ELECTROENCEPHALOGRAM REPORT   Patient: Lindsay Evans       Room #: 3Y864N01  EEG No. ID: [id number] Age: 80 y.o.        Sex: female Referring Physician: Thedore MinsSingh Report Date:  03/24/2014        Interpreting Physician: Thana FarrEYNOLDS,Briane Birden D  History: Lindsay Evans is an 80 y.o. female with rapidly improving neurological symptoms  Medications:  Scheduled: .  stroke: mapping our early stages of recovery book   Does not apply Once  . cefTRIAXone (ROCEPHIN)  IV  1 g Intravenous Q24H  . clopidogrel  75 mg Oral Daily  . heparin  5,000 Units Subcutaneous 3 times per day  . levothyroxine  25 mcg Oral Daily  . loratadine  10 mg Oral Daily  . memantine  5 mg Oral BID  . senna  1 tablet Oral BID  . sulfamethoxazole-trimethoprim  1 tablet Oral BID  . vitamin B-12  1,000 mcg Oral Daily    Conditions of Recording:  This is a 16 channel EEG carried out with the patient in the awake, agitated and uncooperative state.  Description:  The waking background activity is slow and poorly organized.  It consists mostly of a low voltage poorly organized theta activity but delta activity is intermixed frequently as well.  Faster rhythms such as alpha activity are only seen rarely and not sustained.  The patient will not cooperate and close his eyes to evaluate a posterior background rhythm.    No epileptiform activity is noted.  The patient does not drowse or sleep.  Hyperventilation and intermittent photic stimulation were not performed.  IMPRESSION: This is an abnormal EEG secondary to general background slowing.  This finding may be seen with a diffuse disturbance that is etiologically nonspecific, but may include a metabolic encephalopathy, among other possibilities.  No epileptiform activity was noted.     Thana FarrLeslie Leonda Cristo, MD [facility] [phone] 03/24/2014, 7:45 PM

## 2014-03-24 NOTE — Plan of Care (Signed)
Problem: Acute Treatment Outcomes Goal: 02 Sats > 94% Outcome: Completed/Met Date Met:  03/24/14

## 2014-03-24 NOTE — H&P (Signed)
Patient Demographics  Lindsay Evans, is a 80 y.o. female  MRN: [id number]   DOB - 03/15/1924  Admit Date - 03/24/2014  Outpatient Primary MD for the patient is Florentina JennyRIPP, HENRY, MD   With History of -  Past Medical History  Diagnosis Date  . Dementia   . Hypertension   . Thyroid disease   . Coronary artery disease   . Dementia   . Complication of anesthesia     blood pressure drops w/ anethesia      Past Surgical History  Procedure Laterality Date  . Lumbar spine surgery    . Coronary artery bypass graft      3 bypasses  . Cholecystectomy    . Inner ear surgery      has metal in ear - no mri per daughter due to metal    in for   Chief Complaint  Patient presents with  . Loss of Consciousness  . Seizures     HPI  Lindsay Evans  is a 80 y.o. female, with advanced dementia, unable to answer questions or follow commands at baseline, lives in nursing home, non-MRI compatible Otic implant, was under hospice care to a few months ago, essential hypertension, CAD status post CABG, hypothyroidism who was having a meal at nursing home being fed by staff, patient had a sudden onset of right facial droop, a stroke was suspected and she was sent to the ER.  In the ER head CT unremarkable, blood work showed mild leukocytosis, neurology saw the patient and requested hospitalist to admit for stroke/TIA workup.    Review of Systems    Unable to follow commands or provide review of systems due to advanced dementia   Social History History  Substance Use Topics  . Smoking status: Never Smoker   . Smokeless tobacco: Never Used  . Alcohol Use: Yes     Comment: Occassional      Family History NO CVA  Prior to Admission medications   Medication Sig Start Date End Date Taking? Authorizing Provider    acetaminophen (TYLENOL) 325 MG tablet Take 650 mg by mouth every 6 (six) hours as needed. For pain   Yes Historical Provider, MD  cetirizine (ZYRTEC) 10 MG tablet Take 10 mg by mouth daily.   Yes Historical Provider, MD  levothyroxine (SYNTHROID, LEVOTHROID) 25 MCG tablet Take 25 mcg by mouth daily.   Yes Historical Provider, MD  LORazepam (ATIVAN) 0.5 MG tablet Take 0.25 mg by mouth 2 (two) times daily as needed.   Yes Historical Provider, MD  memantine (NAMENDA) 5 MG tablet Take 5 mg by mouth 2 (two) times daily.   Yes Historical Provider, MD  senna (SENOKOT) 8.6 MG tablet Take 1 tablet by mouth daily.   Yes Historical Provider, MD  sulfamethoxazole-trimethoprim (BACTRIM DS,SEPTRA DS) 800-160 MG per tablet Take 1 tablet by mouth 2 (two) times daily. 10 day regimen starting 03/17/14 evening, finishing on 03/27/14 with just one  dose.   Yes Historical Provider, MD  vitamin B-12 (CYANOCOBALAMIN) 1000 MCG tablet Take 1,000 mcg by mouth daily.   Yes Historical Provider, MD  levofloxacin (LEVAQUIN) 500 MG tablet Take 1 tablet (500 mg total) by mouth daily. Patient not taking: Reported on 03/24/2014 01/26/12   Henderson CloudEstela Y Hernandez Acosta, MD    Allergies  Allergen Reactions  . Aricept [Donepezil Hydrochloride] Diarrhea  . Aspirin Other (See Comments)    unknown  . Milk-Related Compounds Other (See Comments)    Runny nose    Physical Exam  Vitals  Blood pressure 136/64, pulse 70, temperature 97.7 F (36.5 C), temperature source Axillary, resp. rate 15, SpO2 74 %.   1. General eldelry frail white female lying in bed in NAD,     2. Normal affect and insight, Not Suicidal or Homicidal, confused  3. No F.N deficits, ALL C.Nerves Intact, R facial droop, Strength 4/5 all 4 extremities, Sensation intact all 4 extremities, Plantars down going.  4. Ears and Eyes appear Normal, Conjunctivae clear, PERRLA. Moist Oral Mucosa.  5. Supple Neck, No JVD, No cervical lymphadenopathy appriciated, No  Carotid Bruits.  6. Symmetrical Chest wall movement, Good air movement bilaterally, CTAB.  7. RRR, No Gallops, Rubs or Murmurs, No Parasternal Heave.  8. Positive Bowel Sounds, Abdomen Soft, No tenderness, No organomegaly appriciated,No rebound -guarding or rigidity.  9.  No Cyanosis, Normal Skin Turgor, No Skin Rash or Bruise.  10. Good muscle tone,  joints appear normal , no effusions, Normal ROM.  11. No Palpable Lymph Nodes in Neck or Axillae     Data Review  CBC  Recent Labs Lab 03/24/14 1503 03/24/14 1504  WBC  --  12.8*  HGB 14.3 13.1  HCT 42.0 39.9  PLT  --  145*  MCV  --  95.2  MCH  --  31.3  MCHC  --  32.8  RDW  --  13.8  LYMPHSABS  --  3.5  MONOABS  --  1.2*  EOSABS  --  0.1  BASOSABS  --  0.0   ------------------------------------------------------------------------------------------------------------------  Chemistries   Recent Labs Lab 03/24/14 1503 03/24/14 1504  NA 139 138  K 4.0 4.5  CL 103 101  CO2  --  23  GLUCOSE 92 94  BUN 11 12  CREATININE 1.20* 1.16*  CALCIUM  --  9.3  AST  --  14  ALT  --  8  ALKPHOS  --  98  BILITOT  --  0.3   ------------------------------------------------------------------------------------------------------------------ CrCl cannot be calculated (Unknown ideal weight.). ------------------------------------------------------------------------------------------------------------------ No results for input(s): TSH, T4TOTAL, T3FREE, THYROIDAB in the last 72 hours.  Invalid input(s): FREET3   Coagulation profile  Recent Labs Lab 03/24/14 1504  INR 1.06   ------------------------------------------------------------------------------------------------------------------- No results for input(s): DDIMER in the last 72 hours. -------------------------------------------------------------------------------------------------------------------  Cardiac Enzymes No results for input(s): CKMB, TROPONINI,  MYOGLOBIN in the last 168 hours.  Invalid input(s): CK ------------------------------------------------------------------------------------------------------------------ Invalid input(s): POCBNP   ---------------------------------------------------------------------------------------------------------------  Urinalysis    Component Value Date/Time   COLORURINE YELLOW 01/21/2012 1425   APPEARANCEUR CLOUDY* 01/21/2012 1425   LABSPEC 1.018 01/21/2012 1425   PHURINE 7.0 01/21/2012 1425   GLUCOSEU NEGATIVE 01/21/2012 1425   HGBUR NEGATIVE 01/21/2012 1425   BILIRUBINUR NEGATIVE 01/21/2012 1425   KETONESUR NEGATIVE 01/21/2012 1425   PROTEINUR NEGATIVE 01/21/2012 1425   UROBILINOGEN 2.0* 01/21/2012 1425   NITRITE NEGATIVE 01/21/2012 1425   LEUKOCYTESUR SMALL* 01/21/2012 1425    ----------------------------------------------------------------------------------------------------------------  Imaging results:   Ct  Head (brain) Wo Contrast  03/24/2014   CLINICAL DATA:  Seizures, loss of consciousness.  EXAM: CT HEAD WITHOUT CONTRAST  TECHNIQUE: Contiguous axial images were obtained from the base of the skull through the vertex without intravenous contrast.  COMPARISON:  CT scan of October 22, 2013.  FINDINGS: Bony calvarium appears intact. Mild diffuse cortical atrophy is noted. Mild chronic ischemic white matter disease is noted. No mass effect or midline shift is noted. Ventricular size is within normal limits. There is no evidence of mass lesion, hemorrhage or acute infarction.  IMPRESSION: Mild diffuse cortical atrophy. Mild chronic ischemic white matter disease. No acute intracranial abnormality seen.   Electronically Signed   By: Roque Lias M.D.   On: 03/24/2014 15:24    My personal review of EKG:  1st EKG  - Rhythm NSR, non specifice ST changes, 2nd EKG ? A flutter    Assessment & Plan    1. TIA versus CVA. Will be admitted to a telemetry bed, repeat CT scan in the morning as she  has an MRI non-compatible or tic implant, check echogram-carotid-lipid panel-A1c, evaluated by PT-OT and speech. Plavix for neurology for secondary prevention.  I have discussed with patient's daughters one of which is her power of attorney about the possibility of a blood thinner if she is found to be in atrial flutter/fib. They will decide about this and let us know tomorrow. Risks and benefits clearly explained.    2. Possible atrial flutter on second EKG. Currently sinus on rhythm, will monitor on EKG for the possibility of paroxysmal atrial flutter/fibrillation, check TSH, as above possible use of anticoagulation discussed with family they will decide upon it tomorrow. Currently rate is 70/m. If needed rate controlling agents can be added based on her telemetry results.    3. Hypothyroidism. Check TSH continue home dose Synthroid.    4. Advanced dementia. At risk for delirium, minimize narcotics, is on when necessary Benzo low-dose which will be continued. Continue Namenda.    5. Mild leukocytosis. No cough, lung exam stable, likely has UTI, check UA, place on Rocephin repeat CBC.     DVT Prophylaxis Heparin    AM Labs Ordered, also please review Full Orders  Family Communication: Admission, patients condition and plan of care including tests being ordered have been discussed with the patient and 2 daughters who indicate understanding and agree with the plan and Code Status.  Code Status DNR  Likely DC to  SNF  Condition GUARDED    Time spent in minutes : 35    SINGH,PRASHANT K M.D on 03/24/2014 at 5:09 PM  Between 7am to 7pm - Pager - [phone]  After 7pm go to [email or web address] - password [email or web address]  And look for the night coverage person covering me after hours  [facility] Office  [phone]

## 2014-03-24 NOTE — Progress Notes (Signed)
EEG completed, results pending. 

## 2014-03-24 NOTE — ED Notes (Addendum)
Per EMS: Pt from [facility] in for eval of syncope and "seizure like activity" noted by staff at 1303 today while pt was eating lunch. EMS denies any fall or head injury, pt was in wheelchair at the time of the incident. Upon arrival EMS states facility noted right sided facial droop, EMS noted no neuro deficits at the time. Pt with hx of alztheimers and unable to follow commands upon arrival to ED noted by this RN. nad noted. Pt DNR and has MOST form.

## 2014-03-24 NOTE — ED Notes (Signed)
CBG = 91  RN informed of result.

## 2014-03-24 NOTE — ED Notes (Signed)
Facility called and RN donna states pt LSW was 1215 when pt had "syncopal episode" and started to have slurred speech and also right sided facial droop while being fed by Med Tech at facility. Lupita LeashDonna states pt was noted to make "grunting noise then went unresponsive and slumped over in chair." Lupita LeashDonna checked VS and noted thready 52 HR, when pt started to

## 2014-03-24 NOTE — ED Notes (Signed)
Dr. Patria Maneampos made aware of facility statements of right sided facial droop, no droop noted at this time by this RN.

## 2014-03-24 NOTE — ED Provider Notes (Signed)
CSN: [id number]     Arrival date & time 03/24/14  1412 History   First MD Initiated Contact with Patient 03/24/14 1505     Chief Complaint  Patient presents with  . Loss of Consciousness  . Seizures     (Consider location/radiation/quality/duration/timing/severity/associated sxs/prior Treatment) HPI   Lindsay Evans is a 80 y.o. female who was at her nursing facility, eating, being said, by a tech, when she started to have slurred speech and a right facial droop.  Patient made some grunting sounds, then became unresponsive.  Her pulse was reported to be low at that time, at 52 bpm.  She was transferred here, and made code stroke on arrival.  At the time I saw the patient, her family members were with her and were able to give history.  Her daughter states that the patient has recently been allowed to eat more thickened fluids, and eat on her own.  There has been no known choking episodes, according to the daughter.  Level V caveat- dementia   Past Medical History  Diagnosis Date  . Dementia   . Hypertension   . Thyroid disease   . Coronary artery disease   . Dementia   . Complication of anesthesia     blood pressure drops w/ anethesia   Past Surgical History  Procedure Laterality Date  . Lumbar spine surgery    . Coronary artery bypass graft      3 bypasses  . Cholecystectomy    . Inner ear surgery      has metal in ear - no mri per daughter due to metal   No family history on file. History  Substance Use Topics  . Smoking status: Never Smoker   . Smokeless tobacco: Never Used  . Alcohol Use: Yes     Comment: Occassional   OB History    No data available     Review of Systems  All other systems reviewed and are negative.     Allergies  Aricept; Aspirin; and Milk-related compounds  Home Medications   Prior to Admission medications   Medication Sig Start Date End Date Taking? Authorizing Provider  acetaminophen (TYLENOL) 325 MG tablet Take 650 mg by mouth  every 6 (six) hours as needed. For pain   Yes Historical Provider, MD  cetirizine (ZYRTEC) 10 MG tablet Take 10 mg by mouth daily.   Yes Historical Provider, MD  levothyroxine (SYNTHROID, LEVOTHROID) 25 MCG tablet Take 25 mcg by mouth daily.   Yes Historical Provider, MD  LORazepam (ATIVAN) 0.5 MG tablet Take 0.25 mg by mouth 2 (two) times daily as needed.   Yes Historical Provider, MD  memantine (NAMENDA) 5 MG tablet Take 5 mg by mouth 2 (two) times daily.   Yes Historical Provider, MD  senna (SENOKOT) 8.6 MG tablet Take 1 tablet by mouth daily.   Yes Historical Provider, MD  sulfamethoxazole-trimethoprim (BACTRIM DS,SEPTRA DS) 800-160 MG per tablet Take 1 tablet by mouth 2 (two) times daily. 10 day regimen starting 03/17/14 evening, finishing on 03/27/14 with just one dose.   Yes Historical Provider, MD  vitamin B-12 (CYANOCOBALAMIN) 1000 MCG tablet Take 1,000 mcg by mouth daily.   Yes Historical Provider, MD  levofloxacin (LEVAQUIN) 500 MG tablet Take 1 tablet (500 mg total) by mouth daily. Patient not taking: Reported on 03/24/2014 01/26/12   Henderson CloudEstela Y Hernandez Acosta, MD   BP 123/50 mmHg  Pulse 69  Temp(Src) 97.7 F (36.5 C) (Axillary)  Resp 14  SpO2 98%  Physical Exam  Constitutional: She is oriented to person, place, and time. She appears well-developed.  Elderly, frail  HENT:  Head: Normocephalic and atraumatic.  Right Ear: External ear normal.  Left Ear: External ear normal.  Eyes: Conjunctivae and EOM are normal. Pupils are equal, round, and reactive to light.  Neck: Normal range of motion and phonation normal. Neck supple.  Cardiovascular: Normal rate, regular rhythm and normal heart sounds.   Pulmonary/Chest: Effort normal and breath sounds normal. She exhibits no bony tenderness.  Abdominal: Soft. There is no tenderness.  Musculoskeletal: Normal range of motion.  Neurological: She is alert and oriented to person, place, and time. No cranial nerve deficit or sensory deficit.  She exhibits normal muscle tone. Coordination normal.  Alert, talkative, no dysarthria.  No nystagmus.  No facial asymmetry.  Bilateral normal grip strength in hands.  Intact ability to elevate both legs off the stretcher.  There is mild cogwheeling rigidity of the legs bilaterally.  Patient is speaking and trying to determine what time it is, by looking at a clock.  Skin: Skin is warm, dry and intact.  Psychiatric: She has a normal mood and affect. Her behavior is normal.  Nursing note and vitals reviewed.   ED Course  Procedures (including critical care time) Thrombolysis consideration -The patient was evaluated on arrival by stroke neurologist, who decided to not offer thrombolysis because the patient was improving, and may have had a seizure.  Medications  clopidogrel (PLAVIX) tablet 75 mg (not administered)    Patient Vitals for the past 24 hrs:  BP Temp Temp src Pulse Resp SpO2  03/24/14 1520 (!) 123/50 mmHg - - 69 14 98 %  03/24/14 1515 130/66 mmHg - - - 14 -  03/24/14 1512 133/63 mmHg - - 67 22 100 %  03/24/14 1507 - - - 70 18 98 %  03/24/14 1506 - - - 68 15 100 %  03/24/14 1505 - - - 65 20 94 %  03/24/14 1504 - - - 66 14 100 %  03/24/14 1503 - - - (!) 54 16 95 %  03/24/14 1456 - - - - 18 -  03/24/14 1455 - - - - 18 -  03/24/14 1454 - - - - 17 -  03/24/14 1453 - - - - 15 -  03/24/14 1452 - - - - 20 -  03/24/14 1451 - - - - 21 -  03/24/14 1450 - - - - 19 -  03/24/14 1449 - - - - 19 -  03/24/14 1448 - - - - 17 -  03/24/14 1416 146/68 mmHg 97.7 F (36.5 C) Axillary 73 16 92 %  03/24/14 1412 - - - - - 98 %    I discussed the case with Dr. Nino Parsleyomillo who will see as a consultant and recommends hospitalist admission.  1634- Pt. Failed swallow screen for history of dysphagia, will keep NPO for now, until she can be evaluated by Speech Therapy  4:10 PM-Consult complete with Dr. Thedore MinsSingh. Patient case explained and discussed. He agrees to admit patient for further evaluation and  treatment. Call ended at 1633  Labs Review Labs Reviewed  CBC - Abnormal; Notable for the following:    WBC 12.8 (*)    Platelets 145 (*)    All other components within normal limits  DIFFERENTIAL - Abnormal; Notable for the following:    Neutro Abs 8.1 (*)    Monocytes Absolute 1.2 (*)    All other components within normal limits  COMPREHENSIVE METABOLIC PANEL - Abnormal; Notable for the following:    Creatinine, Ser 1.16 (*)    GFR calc non Af Amer 41 (*)    GFR calc Af Amer 47 (*)    All other components within normal limits  I-STAT CHEM 8, ED - Abnormal; Notable for the following:    Creatinine, Ser 1.20 (*)    All other components within normal limits  PROTIME-INR  APTT  CBC WITH DIFFERENTIAL  HEMOGLOBIN A1C  LIPID PANEL  I-STAT TROPOININ, ED  CBG MONITORING, ED  CBG MONITORING, ED    Imaging Review Ct Head (brain) Wo Contrast  03/24/2014   CLINICAL DATA:  Seizures, loss of consciousness.  EXAM: CT HEAD WITHOUT CONTRAST  TECHNIQUE: Contiguous axial images were obtained from the base of the skull through the vertex without intravenous contrast.  COMPARISON:  CT scan of October 22, 2013.  FINDINGS: Bony calvarium appears intact. Mild diffuse cortical atrophy is noted. Mild chronic ischemic white matter disease is noted. No mass effect or midline shift is noted. Ventricular size is within normal limits. There is no evidence of mass lesion, hemorrhage or acute infarction.  IMPRESSION: Mild diffuse cortical atrophy. Mild chronic ischemic white matter disease. No acute intracranial abnormality seen.   Electronically Signed   By: Roque Lias M.D.   On: 03/24/2014 15:24     EKG Interpretation   Date/Time:  Friday March 24 2014 14:20:12 EST Ventricular Rate:  66 PR Interval:  174 QRS Duration: 74 QT Interval:  402 QTC Calculation: 421 R Axis:   86 Text Interpretation:  Normal sinus rhythm Possible Left atrial enlargement  Nonspecific ST and T wave abnormality Abnormal ECG  since last tracing no  significant change Confirmed by Effie Shy  MD, Mechele Collin ([id number]) on 03/24/2014  4:02:39 PM      MDM   Final diagnoses:  Aphasia  Stroke    Nonspecific episode, possibly associated with a choking spell, seizure or TIA.  She did have bradycardia at the scene.  She will require further observation and admission for evaluation of possible causes of her period of altered mental status with facial weakness.  NuPlan: Admitrsing Notes Reviewed/ Care Coordinated Applicable Imaging Reviewed Interpretation of Laboratory Data incorporated into ED treatment   Plan: Admit   Flint Melter, MD 03/24/14 [phone]

## 2014-03-24 NOTE — Code Documentation (Signed)
80 year old [facility] with [facility] sx.  She resides at a facility where she was LSW at 1215.  At 1300 she was being assisted with her lunch when the tech noticed slurred speech and right facial droop.  EMS reported no focal deficits noted.  Code stroke was called in the ED.   She is alert - warm and dry - pale - speech is slurred but rapidly improving per her daughter.  At baseline she does not follow commands - can read some and follow commands per daughter but takes a "really long time and then she may not want to do it."  Daughter describes what sounds like social speech at times.  Per family advanced dementia.  MAE x4  spont and purposeful but unable to understand commands to rule out drift.  Attends to both sides.  Blinks to threat bilaterally.  Looks like right facial droop but can overcome it with spontaneous smile.  Will have social phrase speech at times "i want to go home"  Etc.  She is pleasant.  NIHHS 7  - with limitations secondary to receptive aphasia.  Daughter states her baseline is receptive aphasia.  Daughter feels her difference is more slurring of speech - but agrees it is getting much better and closer to baseline.  Dr. Leroy Kennedyamilo present - family updated. Outside tPA window.   Handoff to [employer] Products and ChemicalsDanille RN.

## 2014-03-25 ENCOUNTER — Inpatient Hospital Stay (HOSPITAL_COMMUNITY): Payer: Medicare Other

## 2014-03-25 DIAGNOSIS — N3000 Acute cystitis without hematuria: Secondary | ICD-10-CM

## 2014-03-25 DIAGNOSIS — F0391 Unspecified dementia with behavioral disturbance: Secondary | ICD-10-CM

## 2014-03-25 DIAGNOSIS — G458 Other transient cerebral ischemic attacks and related syndromes: Secondary | ICD-10-CM

## 2014-03-25 LAB — CBC
HEMATOCRIT: 38.7 % (ref 36.0–46.0)
HEMOGLOBIN: 12.5 g/dL (ref 12.0–15.0)
MCH: 30.3 pg (ref 26.0–34.0)
MCHC: 32.3 g/dL (ref 30.0–36.0)
MCV: 93.9 fL (ref 78.0–100.0)
Platelets: 147 10*3/uL — ABNORMAL LOW (ref 150–400)
RBC: 4.12 MIL/uL (ref 3.87–5.11)
RDW: 13.7 % (ref 11.5–15.5)
WBC: 8.5 10*3/uL (ref 4.0–10.5)

## 2014-03-25 LAB — LIPID PANEL
Cholesterol: 249 mg/dL — ABNORMAL HIGH (ref 0–200)
HDL: 38 mg/dL — ABNORMAL LOW (ref >39)
LDL Cholesterol: 185 mg/dL — ABNORMAL HIGH (ref 0–99)
Total CHOL/HDL Ratio: 6.6 RATIO
Triglycerides: 128 mg/dL (ref <150)
VLDL: 26 mg/dL (ref 0–40)

## 2014-03-25 LAB — BASIC METABOLIC PANEL
Anion gap: 11 (ref 5–15)
BUN: 8 mg/dL (ref 6–23)
CALCIUM: 9.1 mg/dL (ref 8.4–10.5)
CO2: 25 mEq/L (ref 19–32)
Chloride: 105 mEq/L (ref 96–112)
Creatinine, Ser: 1.04 mg/dL (ref 0.50–1.10)
GFR, EST AFRICAN AMERICAN: 54 mL/min — AB (ref >90)
GFR, EST NON AFRICAN AMERICAN: 47 mL/min — AB (ref >90)
Glucose, Bld: 84 mg/dL (ref 70–99)
POTASSIUM: 4.2 meq/L (ref 3.7–5.3)
SODIUM: 141 meq/L (ref 137–147)

## 2014-03-25 LAB — HEMOGLOBIN A1C
Hgb A1c MFr Bld: 5.7 % — ABNORMAL HIGH (ref <5.7)
MEAN PLASMA GLUCOSE: 117 mg/dL — AB (ref <117)

## 2014-03-25 LAB — MRSA PCR SCREENING: MRSA BY PCR: NEGATIVE

## 2014-03-25 MED ORDER — LORAZEPAM 2 MG/ML IJ SOLN
0.5000 mg | Freq: Once | INTRAMUSCULAR | Status: AC
  Administered 2014-03-25: 0.5 mg via INTRAVENOUS
  Filled 2014-03-25: qty 1

## 2014-03-25 MED ORDER — LEVOTHYROXINE SODIUM 25 MCG PO TABS: 50.0000 ug | ORAL_TABLET | Freq: Every day | ORAL | Status: AC

## 2014-03-25 MED ORDER — LORAZEPAM 0.5 MG PO TABS: 0.2500 mg | ORAL_TABLET | Freq: Two times a day (BID) | ORAL | Status: AC | PRN

## 2014-03-25 MED ORDER — LORAZEPAM 0.5 MG PO TABS: 0.2500 mg | ORAL_TABLET | Freq: Two times a day (BID) | ORAL | Status: DC | PRN

## 2014-03-25 MED ORDER — CLOPIDOGREL BISULFATE 75 MG PO TABS: 75.0000 mg | ORAL_TABLET | Freq: Every day | ORAL | Status: AC

## 2014-03-25 NOTE — Progress Notes (Signed)
RN called in report to facility. Patient and family stated understanding of instructions given. Awaiting ambulance transportation for pick up. Marin RobertsAisha Bing Duffey RN

## 2014-03-25 NOTE — Evaluation (Signed)
Clinical/Bedside Swallow Evaluation Patient Details  Name: Lindsay Evans MRN: [id number] Date of Birth: 11/08/1923  Today's Date: 03/25/2014 Time: 0950-1005 SLP Time Calculation (min) (ACUTE ONLY): 15 min  Past Medical History:  Past Medical History  Diagnosis Date  . Dementia   . Hypertension   . Thyroid disease   . Coronary artery disease   . Dementia   . Complication of anesthesia     blood pressure drops w/ anethesia   Past Surgical History:  Past Surgical History  Procedure Laterality Date  . Lumbar spine surgery    . Coronary artery bypass graft      3 bypasses  . Cholecystectomy    . Inner ear surgery      has metal in ear - no mri per daughter due to metal   HPI:  Lindsay Evans is an 80 y.o. female with a past medical history significant for HTN, CAD s/p CABG, thyroid disease and advanced dementia, brought in as a code stroke via EMS due to acute onset of right face weakness, dysarthria.  Upon entering room, family is deciding to limit treatment, make comfort goal of care.    Assessment / Plan / Recommendation Clinical Impression  Pt demonstrates adequate consumption of thin liquids and purees with assist needed only for increased awareness of POs. There is no evidence of aspiration or acute impairment. The pt is recommended to consume a puree diet with thin liquids given recent removal of teeth and lack of response to solids trials. Family may provide softened cookies or snacks for pts comfort as desire. No SLP f/u warranted given plan of care. Education complete.     Aspiration Risk  Mild    Diet Recommendation Dysphagia 1 (Puree);Thin liquid   Liquid Administration via: Cup Medication Administration: Whole meds with puree Supervision: Staff to assist with self feeding;Trained caregiver to feed patient;Full supervision/cueing for compensatory strategies Postural Changes and/or Swallow Maneuvers: Seated upright 90 degrees    Other  Recommendations Oral Care  Recommendations: Oral care BID   Follow Up Recommendations  Skilled Nursing facility    Frequency and Duration        Pertinent Vitals/Pain NA    SLP Swallow Goals     Swallow Study Prior Functional Status       General HPI: Lindsay Evans is an 80 y.o. female with a past medical history significant for HTN, CAD s/p CABG, thyroid disease and advanced dementia, brought in as a code stroke via EMS due to acute onset of right face weakness, dysarthria.  Upon entering room, family is deciding to limit treatment, make comfort goal of care.  Type of Study: Bedside swallow evaluation Previous Swallow Assessment: at SNF, pt had teeth removed recently, has been on a pureed diet Diet Prior to this Study: NPO Temperature Spikes Noted: No Respiratory Status: Room air History of Recent Intubation: No Behavior/Cognition: Alert;Cooperative;Requires cueing Oral Cavity - Dentition: Edentulous Self-Feeding Abilities: Total assist Patient Positioning: Upright in bed Baseline Vocal Quality: Clear;Low vocal intensity Volitional Cough: Cognitively unable to elicit Volitional Swallow: Unable to elicit    Oral/Motor/Sensory Function Overall Oral Motor/Sensory Function: Appears within functional limits for tasks assessed   Ice Chips     Thin Liquid Thin Liquid: Within functional limits Presentation: Cup;Self Fed    Nectar Thick Nectar Thick Liquid: Not tested   Honey Thick Honey Thick Liquid: Not tested   Puree Puree: Within functional limits   Solid   GO    Solid: Impaired Oral Phase Impairments: Impaired  mastication;Poor awareness of bolus;Impaired anterior to posterior transit Oral Phase Functional Implications: Oral holding      Harlon DittyBonnie Cambelle Suchecki, MA CCC-SLP [phone]  Barkley Kratochvil, Riley NearingBonnie Caroline 03/25/2014,10:18 AM

## 2014-03-25 NOTE — Evaluation (Signed)
Physical Therapy Evaluation Patient Details Name: Lindsay Evans MRN: [id number] DOB: November 27, 1923 Today's Date: 03/25/2014   History of Present Illness  Lindsay Evans is an 80 y.o. female with a past medical history significant for HTN, CAD s/p CABG, thyroid disease and advanced dementia, brought in as a code stroke via EMS due to acute onset of right face weakness, dysarthria.  Clinical Impression  Patient's family present and report she is not quite back to baseline and is nearing baseline.  They feel she is appropriate to return to prior living situation.  Patient will benefit from PT in SNF to continue to progress ambulation and balance.      Follow Up Recommendations SNF    Equipment Recommendations  None recommended by PT    Recommendations for Other Services       Precautions / Restrictions Precautions Precautions: Fall      Mobility  Bed Mobility Overal bed mobility: Needs Assistance Bed Mobility: Supine to Sit;Sit to Supine     Supine to sit: Mod assist Sit to supine: Min assist   General bed mobility comments: once supine to sit initiated, patient performed with min assistance; assistance for legs in sit to supine  Transfers Overall transfer level: Needs assistance Equipment used: Rolling walker (2 wheeled) Transfers: Sit to/from Stand Sit to Stand: Mod assist         General transfer comment: mod assist to initiate movement; once initiated, able to reach upright with min assist.  Required assistance to maintain upright due to posterior lean  Ambulation/Gait Ambulation/Gait assistance: Mod assist Ambulation Distance (Feet): 30 Feet Assistive device: Rolling walker (2 wheeled) Gait Pattern/deviations: Step-to pattern;Shuffle;Narrow base of support;Decreased weight shift to right;Decreased weight shift to left;Decreased stride length;Leaning posteriorly Gait velocity: decreased      Stairs            Wheelchair Mobility    Modified Rankin  (Stroke Patients Only)       Balance Overall balance assessment: Needs assistance Sitting-balance support: Feet supported;No upper extremity supported Sitting balance-Leahy Scale: Fair     Standing balance support: Bilateral upper extremity supported Standing balance-Leahy Scale: Poor                               Pertinent Vitals/Pain Pain Assessment: No/denies pain    Home Living Family/patient expects to be discharged to:: Skilled nursing facility                      Prior Function Level of Independence: Needs assistance   Gait / Transfers Assistance Needed: able to walk with rollator and min assistance per daughter  ADL's / Homemaking Assistance Needed: dependent        Hand Dominance        Extremity/Trunk Assessment   Upper Extremity Assessment: Generalized weakness           Lower Extremity Assessment: Generalized weakness         Communication   Communication:  (able to understand speech; does not speak much)  Cognition Arousal/Alertness: Awake/alert Behavior During Therapy: WFL for tasks assessed/performed Overall Cognitive Status: History of cognitive impairments - at baseline                      General Comments      Exercises        Assessment/Plan    PT Assessment All further PT needs can be met in  the next venue of care  PT Diagnosis Generalized weakness   PT Problem List Decreased activity tolerance;Decreased balance;Decreased mobility;Decreased safety awareness  PT Treatment Interventions     PT Goals (Current goals can be found in the Care Plan section) Acute Rehab PT Goals PT Goal Formulation: All assessment and education complete, DC therapy    Frequency     Barriers to discharge        Co-evaluation               End of Session Equipment Utilized During Treatment: Gait belt Activity Tolerance: Patient tolerated treatment well Patient left: in bed;with call bell/phone within  reach;with bed alarm set           Time: 1051-1130 PT Time Calculation (min) (ACUTE ONLY): 39 min   Charges:   PT Evaluation $Initial PT Evaluation Tier I: 1 Procedure PT Treatments $Gait Training: 8-22 mins   PT G CodesShanna Cisco 03/25/2014, 11:31 AM  03/25/2014 [place], [place]

## 2014-03-25 NOTE — Progress Notes (Signed)
Discussed with Dr. Jerral RalphGhimire - family does not want aggressive workup.  Stroke team will sign off at this time. Please call if we can be of further service.  Delton Seeavid Rinehuls PA-C [facility] Pager [phone] 03/25/2014, 11:07 AM

## 2014-03-25 NOTE — Plan of Care (Signed)
Problem: Consults Goal: Ischemic Stroke Patient Education See Patient Education Module for education specifics.  Outcome: Completed/Met Date Met:  03/25/14

## 2014-03-25 NOTE — Discharge Summary (Signed)
PATIENT DETAILS Name: Lindsay Evans Age: 80 y.o. Sex: female Date of Birth: 06/09/1923 MRN: [id number]. Admitting Physician: Thurnell Lose, MD QAS:[id number], Mallie Mussel, MD  Admit Date: 03/24/2014 Discharge date: 03/25/2014  Recommendations for Outpatient Follow-up:  1. Please have palliative care follow patient after discharge 2. DO NOT RESUSCITATE, patient's family do not want any heroic measures or aggressive care, primarily focus on comfort. They only want antibiotics for minor infections. Do not transfer to hospital unless comfort needs cannot be met at her facility.Please see updated  MOST form on discharge 3. Please check TSH in 3 months  PRIMARY DISCHARGE DIAGNOSIS:  Principal Problem:   Brain TIA Active Problems:   Dementia   Hypertension   Coronary artery disease   Syncope   Altered mental state   TIA (transient ischemic attack)      PAST MEDICAL HISTORY: Past Medical History  Diagnosis Date  . Dementia   . Hypertension   . Thyroid disease   . Coronary artery disease   . Dementia   . Complication of anesthesia     blood pressure drops w/ anethesia    DISCHARGE MEDICATIONS: Current Discharge Medication List    START taking these medications   Details  clopidogrel (PLAVIX) 75 MG tablet Take 1 tablet (75 mg total) by mouth daily.      CONTINUE these medications which have CHANGED   Details  levothyroxine (SYNTHROID, LEVOTHROID) 25 MCG tablet Take 2 tablets (50 mcg total) by mouth daily.    LORazepam (ATIVAN) 0.5 MG tablet Take 0.5 tablets (0.25 mg total) by mouth 2 (two) times daily as needed. Qty: 30 tablet, Refills: 0      CONTINUE these medications which have NOT CHANGED   Details  acetaminophen (TYLENOL) 325 MG tablet Take 650 mg by mouth every 6 (six) hours as needed. For pain    cetirizine (ZYRTEC) 10 MG tablet Take 10 mg by mouth daily.    memantine (NAMENDA) 5 MG tablet Take 5 mg by mouth 2 (two) times daily.    senna (SENOKOT) 8.6 MG tablet  Take 1 tablet by mouth daily.    sulfamethoxazole-trimethoprim (BACTRIM DS,SEPTRA DS) 800-160 MG per tablet Take 1 tablet by mouth 2 (two) times daily. 10 day regimen starting 03/17/14 evening, finishing on 03/27/14 with just one dose.    vitamin B-12 (CYANOCOBALAMIN) 1000 MCG tablet Take 1,000 mcg by mouth daily.      STOP taking these medications     levofloxacin (LEVAQUIN) 500 MG tablet         ALLERGIES:   Allergies  Allergen Reactions  . Aricept [Donepezil Hydrochloride] Diarrhea  . Aspirin Other (See Comments)    unknown  . Milk-Related Compounds Other (See Comments)    Runny nose    BRIEF HPI:  See H&P, Labs, Consult and Test reports for all details in brief, patient is a 80 year old female with advanced dementia unable to answer questions or follow any commands at baseline, barely able to ambulate (ambulates with significant assistance and is a high fall risk) who is a resident of a local skilled nursing facility was brought to the hospital for sudden onset of right facial droop.    CONSULTATIONS:   neurology  PERTINENT RADIOLOGIC STUDIES: Ct Head Wo Contrast  03/25/2014   CLINICAL DATA:  Sudden onset right facial droop at nursing home, subsequent evaluation, personal history of severe dementia and confusion, patient cannot provide history  EXAM: CT HEAD WITHOUT CONTRAST  TECHNIQUE: Contiguous axial images were obtained from  the base of the skull through the vertex without intravenous contrast.  COMPARISON:  03/24/2014  FINDINGS: Calvarium is intact. Moderate diffuse atrophy with moderate low attenuation in the periventricular white matter. No hydrocephalus, with stable ex vacuo ventricular prominence. No evidence of infarct hemorrhage mass or extra-axial fluid.  IMPRESSION: Stable moderately severe involutional change.  No acute findings.   Electronically Signed   By: Skipper Cliche M.D.   On: 03/25/2014 09:47   Ct Head (brain) Wo Contrast  03/24/2014   CLINICAL DATA:   Seizures, loss of consciousness.  EXAM: CT HEAD WITHOUT CONTRAST  TECHNIQUE: Contiguous axial images were obtained from the base of the skull through the vertex without intravenous contrast.  COMPARISON:  CT scan of October 22, 2013.  FINDINGS: Bony calvarium appears intact. Mild diffuse cortical atrophy is noted. Mild chronic ischemic white matter disease is noted. No mass effect or midline shift is noted. Ventricular size is within normal limits. There is no evidence of mass lesion, hemorrhage or acute infarction.  IMPRESSION: Mild diffuse cortical atrophy. Mild chronic ischemic white matter disease. No acute intracranial abnormality seen.   Electronically Signed   By: Sabino Dick M.D.   On: 03/24/2014 15:24     PERTINENT LAB RESULTS: CBC:  Recent Labs  03/24/14 1504 03/25/14 0545  WBC 12.8* 8.5  HGB 13.1 12.5  HCT 39.9 38.7  PLT 145* 147*   CMET CMP     Component Value Date/Time   NA 141 03/25/2014 0545   K 4.2 03/25/2014 0545   CL 105 03/25/2014 0545   CO2 25 03/25/2014 0545   GLUCOSE 84 03/25/2014 0545   BUN 8 03/25/2014 0545   CREATININE 1.04 03/25/2014 0545   CALCIUM 9.1 03/25/2014 0545   PROT 7.0 03/24/2014 1504   ALBUMIN 3.5 03/24/2014 1504   AST 14 03/24/2014 1504   ALT 8 03/24/2014 1504   ALKPHOS 98 03/24/2014 1504   BILITOT 0.3 03/24/2014 1504   GFRNONAA 47* 03/25/2014 0545   GFRAA 54* 03/25/2014 0545    GFR CrCl cannot be calculated (Unknown ideal weight.). No results for input(s): LIPASE, AMYLASE in the last 72 hours. No results for input(s): CKTOTAL, CKMB, CKMBINDEX, TROPONINI in the last 72 hours. Invalid input(s): POCBNP No results for input(s): DDIMER in the last 72 hours.  Recent Labs  03/24/14 1504  HGBA1C 5.0    Recent Labs  03/25/14 0545  CHOL 249*  HDL 38*  LDLCALC 185*  TRIG 128  CHOLHDL 6.6    Recent Labs  03/24/14 1949  TSH 44.200*   No results for input(s): VITAMINB12, FOLATE, FERRITIN, TIBC, IRON, RETICCTPCT in the last 72  hours. Coags:  Recent Labs  03/24/14 1504 03/24/14 1949  INR 1.06 1.02   Microbiology: Recent Results (from the past 240 hour(s))  MRSA PCR Screening     Status: None   Collection Time: 03/25/14  8:23 AM  Result Value Ref Range Status   MRSA by PCR NEGATIVE NEGATIVE Final    Comment:        The GeneXpert MRSA Assay (FDA approved for NASAL specimens only), is one component of a comprehensive MRSA colonization surveillance program. It is not intended to diagnose MRSA infection nor to guide or monitor treatment for MRSA infections.      BRIEF HOSPITAL COURSE:   Principal Problem:   Brain TIA: Suspected TIA, unfortunately has very advanced dementia and a Otic implant in place that does not permit MRI brain. On admission, 12 lead EKG was suggestive  of atrial flutter. Given advanced dementia, high fall risk patient is not a candidate for anticoagulation. I have had a long discussion with patient's daughters Barnetta Chapel over the phone in [place] and [place] at bedside), who agree with not commencing any form of anticoagulation, they're agreeable with just Plavix. Patient has very poor quality of life, and family wants to focus on comfort. I see no benefit in pursuing an echocardiogram and carotid Doppler, and hence have discontinued, family is agreeable and understanding. She was seen by speech therapy, and recommendations are for dysphagia 1 diet. Family is accepting all risks of aspiration.  Active Problems:   Suspected atrial flutter/atrial fibrillation: Not a candidate for anticoagulation. Please see above. Will just maintain on Plavix, as patient has a unknown allergy to aspirin    Hypothyroidism:TSH significantly elevated at 44.2, will increase levothyroxine to 50 mcgg, please repeat TSH in 3-6 months.    Dehydration: Resolved with IV fluids.    Suspected UTI: Urine analysis positive for UTI, urine cultures pending. Very difficult to tell whether patient is actually  symptomatic. It appears she was already on Bactrim prior to this admission, hence this will be continued.    Dementia: Unfortunate lady has advanced dementia that is slowly progressive. Continue Namenda. Not a candidate for any further aggressive care or workup. Please see below.     Palliative care/Ethics: DO NOT RESUSCITATE already established. After a long conversations with daughters Barnetta Chapel over the phone, and Joycelyn Schmid at bedside, it is clear that the family does not desire any aggressive measures or any further workup at this point. After long goals of care discussion, family wants to mostly focus on comfort. They do not want the patient hospitalized in the future unless comfort care goals cannot be met at the facility. They're okay with antibiotics for mild infections while at skilled nursing facility, however if the patient were to develop a severe infection necessitating hospitalization or a life-threatening infection, they not want antibiotics for hospitalization and would want to focus on comfort instead. No IV fluids, or feeding tubes now or ever. MOST form updated, please see paper chart.  TODAY-DAY OF DISCHARGE:  Subjective:   Jaci Desanto today remains confused and at baseline.  Objective:   Blood pressure 137/66, pulse 53, temperature 97.6 F (36.4 C), temperature source Axillary, resp. rate 16, SpO2 99 %.  Intake/Output Summary (Last 24 hours) at 03/25/14 1125 Last data filed at 03/25/14 0200  Gross per 24 hour  Intake      0 ml  Output    830 ml  Net   -830 ml   There were no vitals filed for this visit.  Exam Awake but confused.No new F.N deficits. Does not follow commands, however noted to move all 4 extremities spontaneously.Will not open eyes. [place].AT,PERRAL Supple Neck,No JVD, No cervical lymphadenopathy appriciated.  Symmetrical Chest wall movement, Good air movement bilaterally, CTAB RRR,No Gallops,Rubs or new Murmurs, No Parasternal Heave +ve B.Sounds, Abd  Soft, Non tender, No organomegaly appriciated, No rebound -guarding or rigidity. No Cyanosis, Clubbing or edema, No new Rash or bruise  DISCHARGE CONDITION: Stable  DISPOSITION: SNF  DISCHARGE INSTRUCTIONS:    Activity:  As tolerated with Full fall precautions use walker/cane & assistance as needed  Diet recommendation: Dysphagia 1-with strict aspiration precautions  Discharge Instructions    Call MD for:    Complete by:  As directed   Please see updated MOST form.     Diet - low sodium heart healthy  Complete by:  As directed   Dysphagia 1 diet     Increase activity slowly    Complete by:  As directed           Follow-up Information    Follow up with Reymundo Poll, MD. Schedule an appointment as soon as possible for a visit in 1 week.   Specialty:  Family Medicine   Contact information:   [address] [place] [address] [phone]      Total Time spent on discharge equals 45 minutes.  SignedOren Binet 03/25/2014 11:25 AM

## 2014-03-25 NOTE — Clinical Social Work Psychosocial (Signed)
Clinical Social Work Department BRIEF PSYCHOSOCIAL ASSESSMENT 03/25/2014  Patient:  Lindsay Evans,Lindsay Evans     Account Number:  [id number]     Admit date:  03/24/2014  Clinical Social Worker:  Lavell LusterAMPBELL,Nasira Janusz BRYANT, LCSWA  Date/Time:  03/25/2014 03:05 PM  Referred by:  Physician  Date Referred:  03/25/2014 Referred for  SNF Placement   Other Referral:   NA   Interview type:  Family Other interview type:   Patient unable to contribute to assessment. Patient's daughters Lindsay Evans and Lindsay Evans interviewed.    PSYCHOSOCIAL DATA Living Status:  FACILITY Admitted from facility:  Other Level of care:  Assisted Living Primary support name:  Lindsay Evans and Lindsay Evans Primary support relationship to patient:  CHILD, ADULT Degree of support available:   Support is strong.    CURRENT CONCERNS Current Concerns  Post-Acute Placement   Other Concerns:   NA    SOCIAL WORK ASSESSMENT / PLAN CSW interviewed patient's daughters to complete assessment. [facility] and family plans for the patient to return to this facility at discharge. CSW explained to family that the patient would have to be cleared by facility to return at discharge. Family appeared calm and engaged in assessment. CSW will assist with DC.   Assessment/plan status:  Psychosocial Support/Ongoing Assessment of Needs Other assessment/ plan:   Complete Fl2, Fax, PASRR   Information/referral to community resources:   CSW contact information given.    PATIENT'S/FAMILY'S RESPONSE TO PLAN OF CARE: Patient's daughters plan for the patient to DC back to [facility] once stable for DC. CSW will assist.       Roddie McBryant Saabir Blyth MSW, [place], [place], [phone]

## 2014-03-25 NOTE — Clinical Social Work Note (Signed)
Per MD patient ready to [facility]. RN, patient/family (daughter at bedside), and facility notified of patient's DC. RN given number for report. DC packet on patient's chart. Ambulance transport requested for patient. CSW signing off at this time.   Roddie McBryant Natahsa Marian MSW, [place] Nort[place]CSWA, [place], [phone]

## 2014-03-25 NOTE — Plan of Care (Signed)
Problem: Consults Goal: Skin Care Protocol Initiated - if Braden Score 18 or less If consults are not indicated, leave blank or document N/A  Outcome: Not Applicable Date Met:  03/25/14     

## 2014-03-25 NOTE — Plan of Care (Signed)
Problem: Consults Goal: Diabetes Guidelines if Diabetic/Glucose > 140 If diabetic or lab glucose is > 140 mg/dl - Initiate Diabetes/Hyperglycemia Guidelines & Document Interventions  Outcome: Not Applicable Date Met:  03/25/14     

## 2014-03-25 NOTE — Plan of Care (Signed)
Problem: Consults Goal: Nutrition Consult-if indicated Outcome: Not Applicable Date Met:  58/48/35

## 2014-03-26 LAB — URINE CULTURE
COLONY COUNT: NO GROWTH
Culture: NO GROWTH

## 2015-01-03 IMAGING — CT CT HEAD W/O CM
1 series · 16 of 30 positions shown, 20 images · non-contrast
Comparison: 03/24/2014

CLINICAL DATA: Sudden onset right facial droop at [HOSPITAL],
subsequent evaluation, personal history of severe dementia and
confusion, patient cannot provide history

EXAM:
CT HEAD WITHOUT CONTRAST
TECHNIQUE: Contiguous axial images were obtained from the base of the skull
through the vertex without intravenous contrast.

[Series 3: head 5.0 h30s · axial · 0.42mm/px · z∈[-92,+43]mm · 16 of 30 slices shown, 20 images]
[im 2/30  brain]
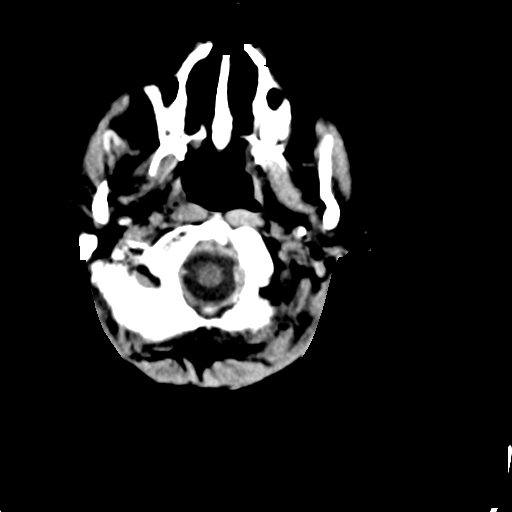
[im 2/30  bone]
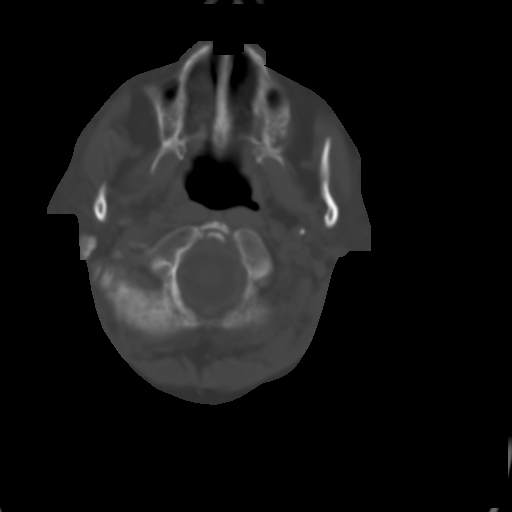
[im 4/30  brain]
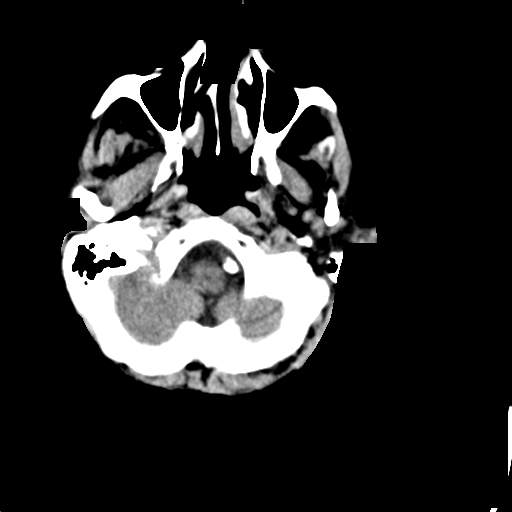
[im 6/30  brain]
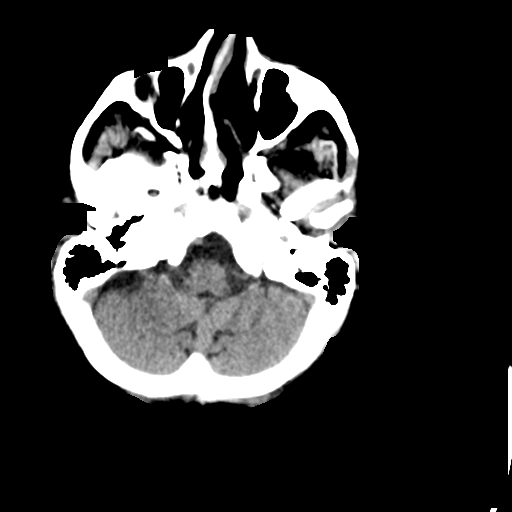
[im 8/30  brain]
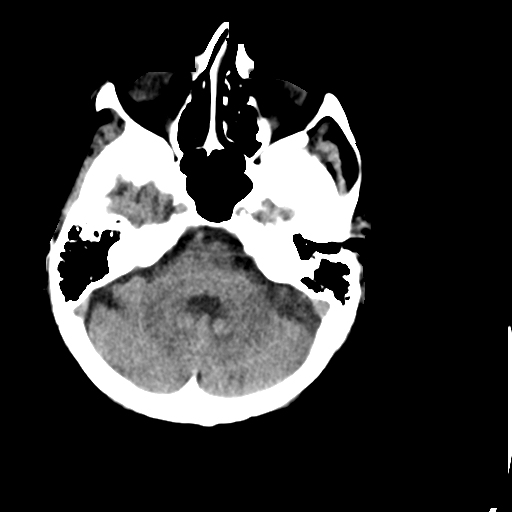
[im 9/30  brain]
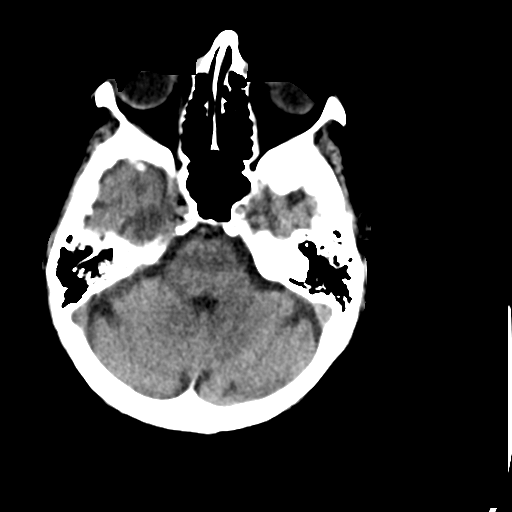
[im 9/30  bone]
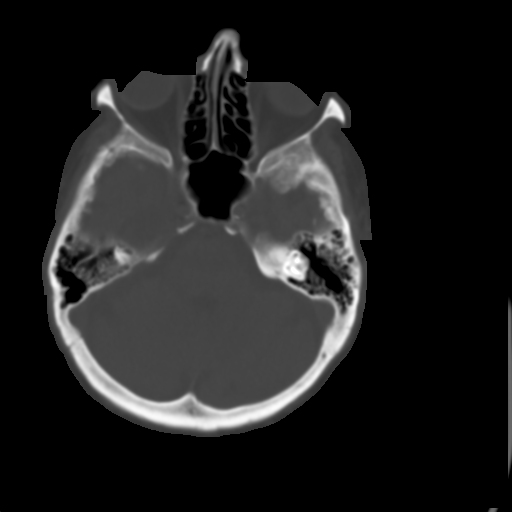
[im 11/30  brain]
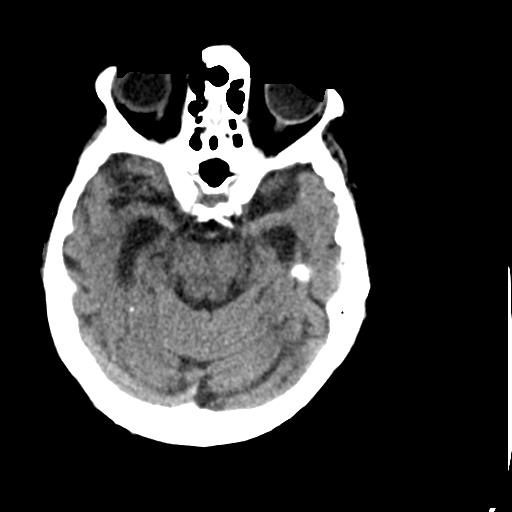
[im 13/30  brain]
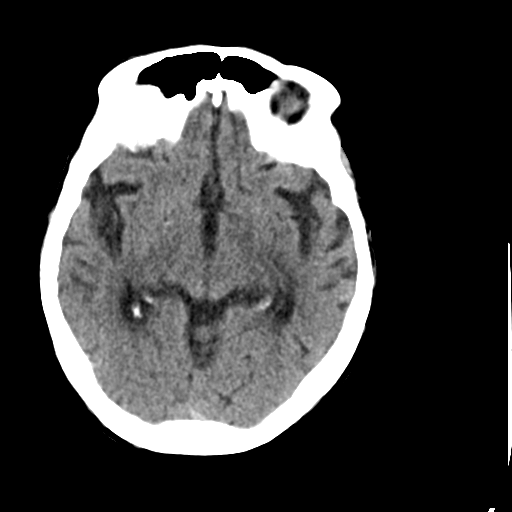
[im 15/30  brain]
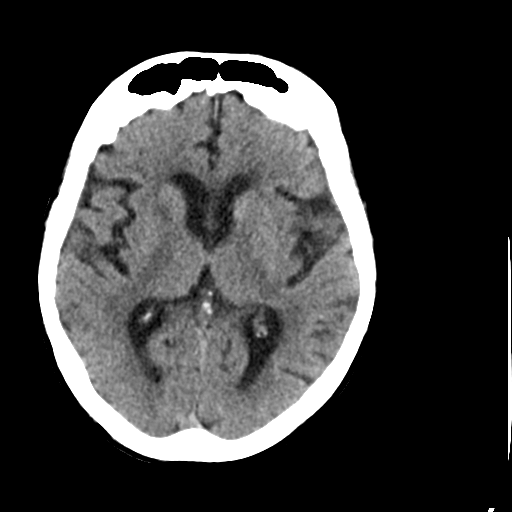
[im 16/30  brain]
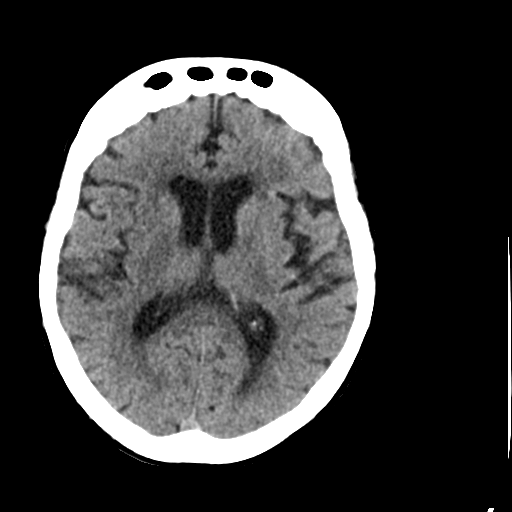
[im 16/30  bone]
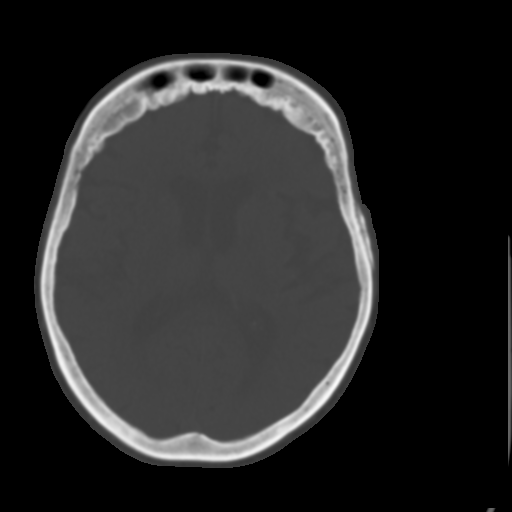
[im 18/30  brain]
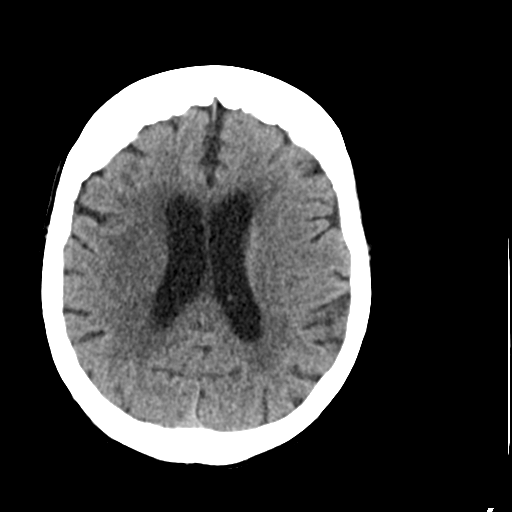
[im 20/30  brain]
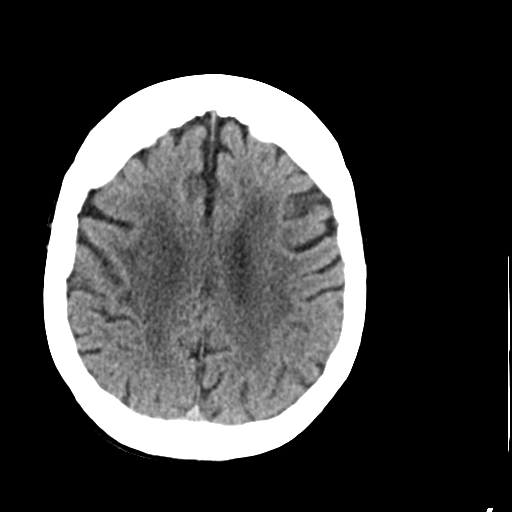
[im 22/30  brain]
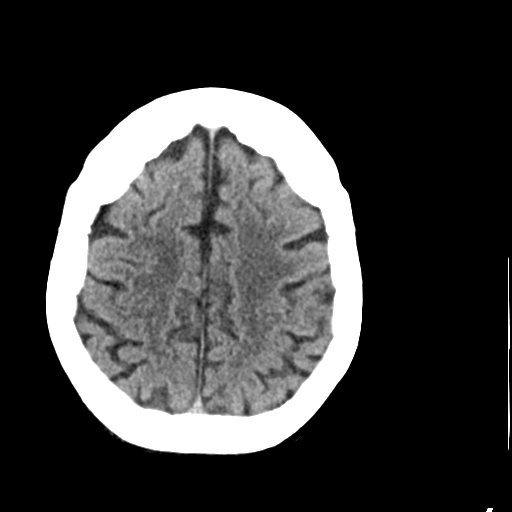
[im 23/30  brain]
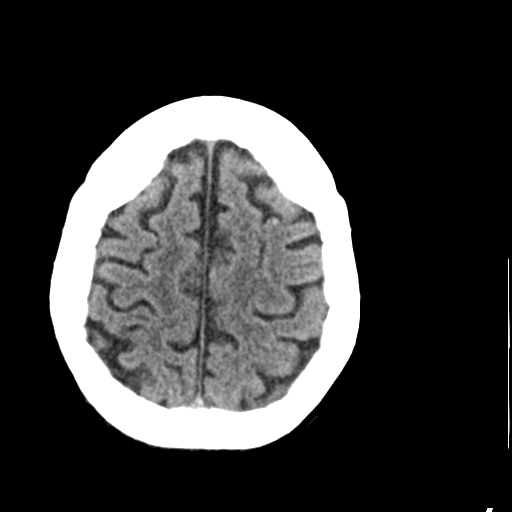
[im 23/30  bone]
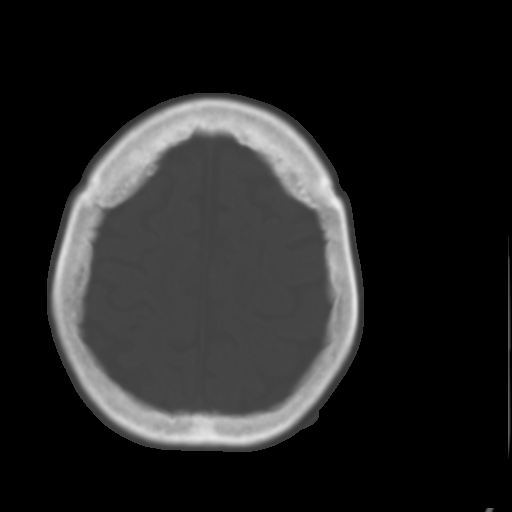
[im 25/30  brain]
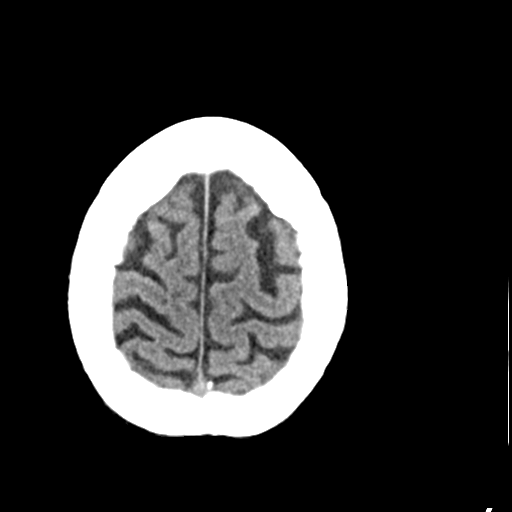
[im 27/30  brain]
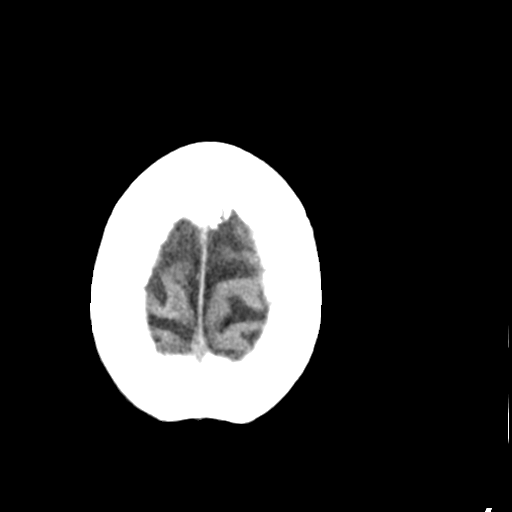
[im 29/30  brain]
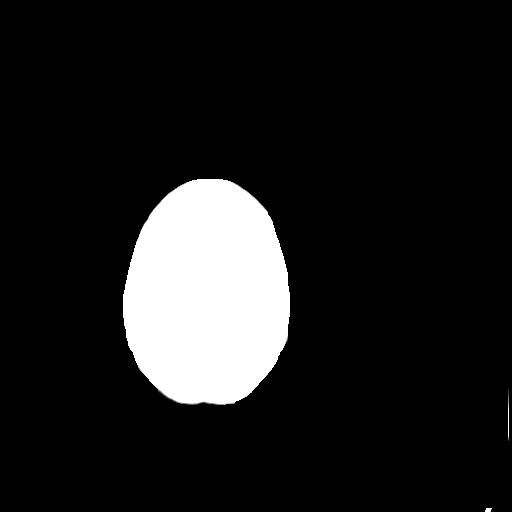

[16 of 30 positions shown; findings below may reference images not displayed]

FINDINGS: Calvarium is intact. Moderate diffuse atrophy with moderate low
attenuation in the periventricular white matter. No hydrocephalus,
with stable ex vacuo ventricular prominence. No evidence of infarct
hemorrhage mass or extra-axial fluid.
IMPRESSION: Stable moderately severe involutional change.  No acute findings.

## 2016-10-26 DEATH — deceased
